# Patient Record
Sex: Male | Born: 1960 | Race: White | Hispanic: No | Marital: Married | Smoking: Never smoker
Health system: Southern US, Community
[De-identification: ages and names within clinical notes are randomized; demographics above are authoritative.]

## PROBLEM LIST (undated history)

## (undated) HISTORY — PX: VASECTOMY: SHX75

---

## 2007-11-20 ENCOUNTER — Emergency Department (HOSPITAL_BASED_OUTPATIENT_CLINIC_OR_DEPARTMENT_OTHER): Admission: EM | Admit: 2007-11-20 | Discharge: 2007-11-20 | Payer: Self-pay | Admitting: Emergency Medicine

## 2016-05-12 ENCOUNTER — Emergency Department (HOSPITAL_COMMUNITY): Payer: BC Managed Care – PPO | Admitting: Certified Registered Nurse Anesthetist

## 2016-05-12 ENCOUNTER — Emergency Department (HOSPITAL_COMMUNITY): Payer: BC Managed Care – PPO

## 2016-05-12 ENCOUNTER — Inpatient Hospital Stay (HOSPITAL_COMMUNITY)
Admission: EM | Admit: 2016-05-12 | Discharge: 2016-05-15 | DRG: 958 | Disposition: A | Discharge status: Rehabilitation Facility | Payer: BC Managed Care – PPO | Attending: Orthopedic Surgery | Admitting: Orthopedic Surgery

## 2016-05-12 ENCOUNTER — Inpatient Hospital Stay (HOSPITAL_COMMUNITY): Payer: BC Managed Care – PPO

## 2016-05-12 ENCOUNTER — Encounter (HOSPITAL_COMMUNITY)
Admission: EM | Disposition: A | Discharge status: Rehabilitation Facility | Payer: Self-pay | Source: Home / Self Care | Attending: Orthopedic Surgery

## 2016-05-12 DIAGNOSIS — S82202B Unspecified fracture of shaft of left tibia, initial encounter for open fracture type I or II: Secondary | ICD-10-CM | POA: Diagnosis present

## 2016-05-12 DIAGNOSIS — S92342A Displaced fracture of fourth metatarsal bone, left foot, initial encounter for closed fracture: Secondary | ICD-10-CM | POA: Diagnosis present

## 2016-05-12 DIAGNOSIS — R0781 Pleurodynia: Secondary | ICD-10-CM | POA: Diagnosis present

## 2016-05-12 DIAGNOSIS — Y92410 Unspecified street and highway as the place of occurrence of the external cause: Secondary | ICD-10-CM

## 2016-05-12 DIAGNOSIS — S82202S Unspecified fracture of shaft of left tibia, sequela: Secondary | ICD-10-CM | POA: Diagnosis present

## 2016-05-12 DIAGNOSIS — M25511 Pain in right shoulder: Secondary | ICD-10-CM | POA: Diagnosis present

## 2016-05-12 DIAGNOSIS — T148XXA Other injury of unspecified body region, initial encounter: Secondary | ICD-10-CM

## 2016-05-12 DIAGNOSIS — D62 Acute posthemorrhagic anemia: Secondary | ICD-10-CM | POA: Diagnosis present

## 2016-05-12 DIAGNOSIS — S7011XA Contusion of right thigh, initial encounter: Secondary | ICD-10-CM | POA: Diagnosis present

## 2016-05-12 DIAGNOSIS — S32591A Other specified fracture of right pubis, initial encounter for closed fracture: Secondary | ICD-10-CM | POA: Diagnosis present

## 2016-05-12 DIAGNOSIS — S82402S Unspecified fracture of shaft of left fibula, sequela: Secondary | ICD-10-CM | POA: Diagnosis not present

## 2016-05-12 DIAGNOSIS — S3022XA Contusion of scrotum and testes, initial encounter: Secondary | ICD-10-CM | POA: Diagnosis present

## 2016-05-12 DIAGNOSIS — T1490XA Injury, unspecified, initial encounter: Secondary | ICD-10-CM | POA: Diagnosis not present

## 2016-05-12 DIAGNOSIS — R609 Edema, unspecified: Secondary | ICD-10-CM | POA: Diagnosis not present

## 2016-05-12 DIAGNOSIS — S3282XA Multiple fractures of pelvis without disruption of pelvic ring, initial encounter for closed fracture: Secondary | ICD-10-CM

## 2016-05-12 DIAGNOSIS — S82232S Displaced oblique fracture of shaft of left tibia, sequela: Secondary | ICD-10-CM | POA: Diagnosis not present

## 2016-05-12 DIAGNOSIS — S92302S Fracture of unspecified metatarsal bone(s), left foot, sequela: Secondary | ICD-10-CM | POA: Diagnosis not present

## 2016-05-12 DIAGNOSIS — S46002S Unspecified injury of muscle(s) and tendon(s) of the rotator cuff of left shoulder, sequela: Secondary | ICD-10-CM | POA: Diagnosis not present

## 2016-05-12 DIAGNOSIS — R52 Pain, unspecified: Secondary | ICD-10-CM | POA: Diagnosis not present

## 2016-05-12 DIAGNOSIS — S92302D Fracture of unspecified metatarsal bone(s), left foot, subsequent encounter for fracture with routine healing: Secondary | ICD-10-CM | POA: Diagnosis not present

## 2016-05-12 DIAGNOSIS — S82832B Other fracture of upper and lower end of left fibula, initial encounter for open fracture type I or II: Secondary | ICD-10-CM | POA: Diagnosis present

## 2016-05-12 DIAGNOSIS — S46002D Unspecified injury of muscle(s) and tendon(s) of the rotator cuff of left shoulder, subsequent encounter: Secondary | ICD-10-CM | POA: Diagnosis not present

## 2016-05-12 DIAGNOSIS — M25562 Pain in left knee: Secondary | ICD-10-CM | POA: Diagnosis not present

## 2016-05-12 DIAGNOSIS — T07XXXA Unspecified multiple injuries, initial encounter: Secondary | ICD-10-CM

## 2016-05-12 DIAGNOSIS — S32409D Unspecified fracture of unspecified acetabulum, subsequent encounter for fracture with routine healing: Secondary | ICD-10-CM | POA: Diagnosis not present

## 2016-05-12 DIAGNOSIS — S92332A Displaced fracture of third metatarsal bone, left foot, initial encounter for closed fracture: Secondary | ICD-10-CM | POA: Diagnosis present

## 2016-05-12 DIAGNOSIS — M25061 Hemarthrosis, right knee: Secondary | ICD-10-CM | POA: Diagnosis not present

## 2016-05-12 DIAGNOSIS — S8292XC Unspecified fracture of left lower leg, initial encounter for open fracture type IIIA, IIIB, or IIIC: Secondary | ICD-10-CM

## 2016-05-12 DIAGNOSIS — M79672 Pain in left foot: Secondary | ICD-10-CM

## 2016-05-12 DIAGNOSIS — S3282XS Multiple fractures of pelvis without disruption of pelvic ring, sequela: Secondary | ICD-10-CM | POA: Diagnosis not present

## 2016-05-12 DIAGNOSIS — Z885 Allergy status to narcotic agent status: Secondary | ICD-10-CM | POA: Diagnosis not present

## 2016-05-12 DIAGNOSIS — S7011XD Contusion of right thigh, subsequent encounter: Secondary | ICD-10-CM | POA: Diagnosis not present

## 2016-05-12 DIAGNOSIS — D696 Thrombocytopenia, unspecified: Secondary | ICD-10-CM | POA: Diagnosis present

## 2016-05-12 DIAGNOSIS — S3022XD Contusion of scrotum and testes, subsequent encounter: Secondary | ICD-10-CM | POA: Diagnosis not present

## 2016-05-12 DIAGNOSIS — S3994XA Unspecified injury of external genitals, initial encounter: Secondary | ICD-10-CM

## 2016-05-12 DIAGNOSIS — S82402E Unspecified fracture of shaft of left fibula, subsequent encounter for open fracture type I or II with routine healing: Secondary | ICD-10-CM | POA: Diagnosis not present

## 2016-05-12 DIAGNOSIS — S82202E Unspecified fracture of shaft of left tibia, subsequent encounter for open fracture type I or II with routine healing: Secondary | ICD-10-CM | POA: Diagnosis not present

## 2016-05-12 DIAGNOSIS — S92322A Displaced fracture of second metatarsal bone, left foot, initial encounter for closed fracture: Secondary | ICD-10-CM | POA: Diagnosis present

## 2016-05-12 DIAGNOSIS — R55 Syncope and collapse: Secondary | ICD-10-CM | POA: Diagnosis not present

## 2016-05-12 DIAGNOSIS — S82392B Other fracture of lower end of left tibia, initial encounter for open fracture type I or II: Secondary | ICD-10-CM | POA: Diagnosis present

## 2016-05-12 HISTORY — PX: ORIF TIBIA FRACTURE: SHX5416

## 2016-05-12 LAB — COMPREHENSIVE METABOLIC PANEL
ALBUMIN: 3.7 g/dL (ref 3.5–5.0)
ALK PHOS: 61 U/L (ref 38–126)
ALT: 18 U/L (ref 17–63)
AST: 33 U/L (ref 15–41)
Anion gap: 9 (ref 5–15)
BILIRUBIN TOTAL: 0.8 mg/dL (ref 0.3–1.2)
BUN: 12 mg/dL (ref 6–20)
CO2: 24 mmol/L (ref 22–32)
CREATININE: 1.14 mg/dL (ref 0.61–1.24)
Calcium: 8.8 mg/dL — ABNORMAL LOW (ref 8.9–10.3)
Chloride: 105 mmol/L (ref 101–111)
GFR calc Af Amer: >60 mL/min (ref >60)
GLUCOSE: 155 mg/dL — AB (ref 65–99)
POTASSIUM: 3.6 mmol/L (ref 3.5–5.1)
Sodium: 138 mmol/L (ref 135–145)
Total Protein: 6.5 g/dL (ref 6.5–8.1)

## 2016-05-12 LAB — I-STAT CHEM 8, ED
BUN: 14 mg/dL (ref 6–20)
CHLORIDE: 103 mmol/L (ref 101–111)
CREATININE: 1.1 mg/dL (ref 0.61–1.24)
Calcium, Ion: 1.13 mmol/L — ABNORMAL LOW (ref 1.15–1.40)
Glucose, Bld: 149 mg/dL — ABNORMAL HIGH (ref 65–99)
HEMATOCRIT: 39 % (ref 39.0–52.0)
Hemoglobin: 13.3 g/dL (ref 13.0–17.0)
Potassium: 3.7 mmol/L (ref 3.5–5.1)
SODIUM: 140 mmol/L (ref 135–145)
TCO2: 27 mmol/L (ref 0–100)

## 2016-05-12 LAB — PROTIME-INR
INR: 1.11
PROTHROMBIN TIME: 14.3 s (ref 11.4–15.2)

## 2016-05-12 LAB — I-STAT CG4 LACTIC ACID, ED: Lactic Acid, Venous: 3.46 mmol/L (ref 0.5–1.9)

## 2016-05-12 LAB — CBC
HCT: 40.6 % (ref 39.0–52.0)
HEMOGLOBIN: 14.1 g/dL (ref 13.0–17.0)
MCH: 31.8 pg (ref 26.0–34.0)
MCHC: 34.7 g/dL (ref 30.0–36.0)
MCV: 91.6 fL (ref 78.0–100.0)
PLATELETS: 203 10*3/uL (ref 150–400)
RBC: 4.43 MIL/uL (ref 4.22–5.81)
RDW: 12.5 % (ref 11.5–15.5)
WBC: 8.9 10*3/uL (ref 4.0–10.5)

## 2016-05-12 LAB — CDS SEROLOGY

## 2016-05-12 LAB — ETHANOL: Alcohol, Ethyl (B): <5 mg/dL (ref <5)

## 2016-05-12 LAB — SAMPLE TO BLOOD BANK

## 2016-05-12 SURGERY — OPEN REDUCTION INTERNAL FIXATION (ORIF) TIBIA FRACTURE
Anesthesia: General | Site: Leg Lower | Laterality: Left

## 2016-05-12 MED ORDER — ENOXAPARIN SODIUM 40 MG/0.4ML ~~LOC~~ SOLN
40.0000 mg | SUBCUTANEOUS | Status: DC
  Administered 2016-05-13 – 2016-05-15 (×3): 40 mg via SUBCUTANEOUS
  Filled 2016-05-12 (×3): qty 0.4

## 2016-05-12 MED ORDER — IOPAMIDOL (ISOVUE-300) INJECTION 61%
INTRAVENOUS | Status: AC
  Administered 2016-05-12: 100 mL via INTRAVENOUS
  Filled 2016-05-12: qty 100

## 2016-05-12 MED ORDER — LIDOCAINE HCL (CARDIAC) 20 MG/ML IV SOLN
INTRAVENOUS | Status: DC | PRN
  Administered 2016-05-12: 90 mg via INTRAVENOUS

## 2016-05-12 MED ORDER — HYDROMORPHONE HCL 1 MG/ML IJ SOLN
INTRAMUSCULAR | Status: AC
  Filled 2016-05-12: qty 1

## 2016-05-12 MED ORDER — FENTANYL CITRATE (PF) 100 MCG/2ML IJ SOLN
100.0000 ug | Freq: Once | INTRAMUSCULAR | Status: AC
  Administered 2016-05-12: 100 ug via INTRAVENOUS
  Filled 2016-05-12: qty 2

## 2016-05-12 MED ORDER — ASPIRIN EC 325 MG PO TBEC: 325.0000 mg | DELAYED_RELEASE_TABLET | Freq: Every day | ORAL | 0 refills | Status: DC

## 2016-05-12 MED ORDER — KETOROLAC TROMETHAMINE 15 MG/ML IJ SOLN
INTRAMUSCULAR | Status: AC
  Filled 2016-05-12: qty 1

## 2016-05-12 MED ORDER — DIPHENHYDRAMINE HCL 12.5 MG/5ML PO ELIX: 12.5000 mg | ORAL_SOLUTION | ORAL | Status: DC | PRN

## 2016-05-12 MED ORDER — ACETAMINOPHEN 500 MG PO TABS
1000.0000 mg | ORAL_TABLET | Freq: Once | ORAL | Status: AC
  Administered 2016-05-12: 1000 mg via ORAL

## 2016-05-12 MED ORDER — POVIDONE-IODINE 7.5 % EX SOLN
Freq: Once | CUTANEOUS | Status: AC
  Administered 2016-05-12: 14:00:00 via TOPICAL

## 2016-05-12 MED ORDER — PROPOFOL 10 MG/ML IV BOLUS
INTRAVENOUS | Status: DC | PRN
  Administered 2016-05-12: 150 mg via INTRAVENOUS

## 2016-05-12 MED ORDER — CEFAZOLIN SODIUM-DEXTROSE 2-4 GM/100ML-% IV SOLN
2.0000 g | Freq: Once | INTRAVENOUS | Status: AC
  Administered 2016-05-12: 2 g via INTRAVENOUS
  Filled 2016-05-12: qty 100

## 2016-05-12 MED ORDER — METHOCARBAMOL 1000 MG/10ML IJ SOLN: 500.0000 mg | Freq: Four times a day (QID) | INTRAVENOUS | Status: DC | PRN

## 2016-05-12 MED ORDER — DOCUSATE SODIUM 100 MG PO CAPS
100.0000 mg | ORAL_CAPSULE | Freq: Two times a day (BID) | ORAL | Status: DC
  Administered 2016-05-12 – 2016-05-15 (×6): 100 mg via ORAL
  Filled 2016-05-12 (×6): qty 1

## 2016-05-12 MED ORDER — METHOCARBAMOL 500 MG PO TABS
500.0000 mg | ORAL_TABLET | Freq: Four times a day (QID) | ORAL | Status: DC | PRN
  Administered 2016-05-12 – 2016-05-14 (×4): 500 mg via ORAL
  Filled 2016-05-12 (×4): qty 1

## 2016-05-12 MED ORDER — NEOSTIGMINE METHYLSULFATE 5 MG/5ML IV SOSY
PREFILLED_SYRINGE | INTRAVENOUS | Status: AC
  Filled 2016-05-12: qty 5

## 2016-05-12 MED ORDER — DOCUSATE SODIUM 100 MG PO CAPS: 100.0000 mg | ORAL_CAPSULE | Freq: Two times a day (BID) | ORAL | 0 refills | Status: DC

## 2016-05-12 MED ORDER — CEFAZOLIN SODIUM-DEXTROSE 2-4 GM/100ML-% IV SOLN
2.0000 g | Freq: Four times a day (QID) | INTRAVENOUS | Status: AC
  Administered 2016-05-12 – 2016-05-13 (×4): 2 g via INTRAVENOUS
  Filled 2016-05-12 (×4): qty 100

## 2016-05-12 MED ORDER — FENTANYL CITRATE (PF) 100 MCG/2ML IJ SOLN
100.0000 ug | Freq: Once | INTRAMUSCULAR | Status: AC
Start: 2016-05-12 — End: 2016-05-12
  Administered 2016-05-12: 100 ug via INTRAVENOUS
  Filled 2016-05-12: qty 2

## 2016-05-12 MED ORDER — GLYCOPYRROLATE 0.2 MG/ML IJ SOLN
INTRAMUSCULAR | Status: DC | PRN
  Administered 2016-05-12: 0.6 mg via INTRAVENOUS

## 2016-05-12 MED ORDER — ROCURONIUM BROMIDE 10 MG/ML (PF) SYRINGE
PREFILLED_SYRINGE | INTRAVENOUS | Status: AC
  Filled 2016-05-12: qty 5

## 2016-05-12 MED ORDER — ACETAMINOPHEN 500 MG PO TABS
ORAL_TABLET | ORAL | Status: AC
  Administered 2016-05-12: 1000 mg via ORAL
  Filled 2016-05-12: qty 2

## 2016-05-12 MED ORDER — KETOROLAC TROMETHAMINE 30 MG/ML IJ SOLN
INTRAMUSCULAR | Status: AC
  Filled 2016-05-12: qty 1

## 2016-05-12 MED ORDER — ACETAMINOPHEN 500 MG PO TABS
1000.0000 mg | ORAL_TABLET | Freq: Three times a day (TID) | ORAL | Status: DC
  Administered 2016-05-12 – 2016-05-15 (×9): 1000 mg via ORAL
  Filled 2016-05-12 (×9): qty 2

## 2016-05-12 MED ORDER — LACTATED RINGERS IV SOLN
INTRAVENOUS | Status: DC
  Administered 2016-05-12 (×2): via INTRAVENOUS

## 2016-05-12 MED ORDER — OXYCODONE HCL 5 MG PO TABS
ORAL_TABLET | ORAL | Status: AC
  Filled 2016-05-12: qty 2

## 2016-05-12 MED ORDER — PROPOFOL 10 MG/ML IV BOLUS
INTRAVENOUS | Status: AC
  Filled 2016-05-12: qty 40

## 2016-05-12 MED ORDER — SUCCINYLCHOLINE CHLORIDE 200 MG/10ML IV SOSY
PREFILLED_SYRINGE | INTRAVENOUS | Status: AC
  Filled 2016-05-12: qty 10

## 2016-05-12 MED ORDER — POLYETHYLENE GLYCOL 3350 17 G PO PACK
17.0000 g | PACK | Freq: Every day | ORAL | Status: DC
  Administered 2016-05-14 – 2016-05-15 (×2): 17 g via ORAL
  Filled 2016-05-12 (×3): qty 1

## 2016-05-12 MED ORDER — ETOMIDATE 2 MG/ML IV SOLN
INTRAVENOUS | Status: AC
  Administered 2016-05-12: 10 mg
  Filled 2016-05-12: qty 10

## 2016-05-12 MED ORDER — OMEPRAZOLE 20 MG PO CPDR: 20.0000 mg | DELAYED_RELEASE_CAPSULE | Freq: Every day | ORAL | 0 refills | Status: DC

## 2016-05-12 MED ORDER — TETANUS-DIPHTH-ACELL PERTUSSIS 5-2.5-18.5 LF-MCG/0.5 IM SUSP
0.5000 mL | Freq: Once | INTRAMUSCULAR | Status: AC
  Administered 2016-05-12: 0.5 mL via INTRAMUSCULAR
  Filled 2016-05-12: qty 0.5

## 2016-05-12 MED ORDER — ENOXAPARIN SODIUM 40 MG/0.4ML ~~LOC~~ SOLN: 40.0000 mg | SUBCUTANEOUS | Status: DC

## 2016-05-12 MED ORDER — GABAPENTIN 300 MG PO CAPS
300.0000 mg | ORAL_CAPSULE | Freq: Once | ORAL | Status: AC
  Administered 2016-05-12: 300 mg via ORAL
  Filled 2016-05-12: qty 1

## 2016-05-12 MED ORDER — HYDROMORPHONE HCL 1 MG/ML IJ SOLN
1.0000 mg | Freq: Once | INTRAMUSCULAR | Status: AC
  Administered 2016-05-12: 1 mg via INTRAVENOUS
  Filled 2016-05-12: qty 1

## 2016-05-12 MED ORDER — ONDANSETRON HCL 4 MG PO TABS: 4.0000 mg | ORAL_TABLET | Freq: Three times a day (TID) | ORAL | 0 refills | Status: DC | PRN

## 2016-05-12 MED ORDER — HYDROMORPHONE HCL 1 MG/ML IJ SOLN
0.5000 mg | INTRAMUSCULAR | Status: DC | PRN
  Administered 2016-05-13: 1 mg via INTRAVENOUS
  Filled 2016-05-12: qty 1

## 2016-05-12 MED ORDER — FENTANYL CITRATE (PF) 250 MCG/5ML IJ SOLN
INTRAMUSCULAR | Status: AC
  Filled 2016-05-12: qty 5

## 2016-05-12 MED ORDER — ACETAMINOPHEN 650 MG RE SUPP: 650.0000 mg | Freq: Four times a day (QID) | RECTAL | Status: DC | PRN

## 2016-05-12 MED ORDER — ONDANSETRON HCL 4 MG PO TABS
4.0000 mg | ORAL_TABLET | Freq: Four times a day (QID) | ORAL | Status: DC | PRN
  Administered 2016-05-14: 4 mg via ORAL
  Filled 2016-05-12: qty 1

## 2016-05-12 MED ORDER — ONDANSETRON HCL 4 MG/2ML IJ SOLN
INTRAMUSCULAR | Status: DC | PRN
  Administered 2016-05-12: 4 mg via INTRAVENOUS

## 2016-05-12 MED ORDER — FENTANYL CITRATE (PF) 100 MCG/2ML IJ SOLN
INTRAMUSCULAR | Status: DC | PRN
  Administered 2016-05-12: 25 ug via INTRAVENOUS
  Administered 2016-05-12 (×3): 50 ug via INTRAVENOUS
  Administered 2016-05-12: 75 ug via INTRAVENOUS

## 2016-05-12 MED ORDER — OXYCODONE-ACETAMINOPHEN 5-325 MG PO TABS: 1.0000 | ORAL_TABLET | ORAL | 0 refills | Status: DC | PRN

## 2016-05-12 MED ORDER — LACTATED RINGERS IV SOLN
INTRAVENOUS | Status: DC
  Administered 2016-05-12 – 2016-05-14 (×3): via INTRAVENOUS

## 2016-05-12 MED ORDER — LACTATED RINGERS IV SOLN: INTRAVENOUS | Status: DC | PRN

## 2016-05-12 MED ORDER — MIDAZOLAM HCL 2 MG/2ML IJ SOLN
INTRAMUSCULAR | Status: AC
  Filled 2016-05-12: qty 2

## 2016-05-12 MED ORDER — KETOROLAC TROMETHAMINE 15 MG/ML IJ SOLN
15.0000 mg | Freq: Three times a day (TID) | INTRAMUSCULAR | Status: DC | PRN
  Administered 2016-05-12 – 2016-05-15 (×3): 15 mg via INTRAVENOUS
  Filled 2016-05-12 (×2): qty 1

## 2016-05-12 MED ORDER — CEFAZOLIN SODIUM 1 G IJ SOLR
INTRAMUSCULAR | Status: AC
  Filled 2016-05-12: qty 10

## 2016-05-12 MED ORDER — ONDANSETRON HCL 4 MG/2ML IJ SOLN
INTRAMUSCULAR | Status: AC
  Filled 2016-05-12: qty 2

## 2016-05-12 MED ORDER — ALBUMIN HUMAN 5 % IV SOLN
INTRAVENOUS | Status: DC | PRN
  Administered 2016-05-12: 16:00:00 via INTRAVENOUS

## 2016-05-12 MED ORDER — OXYCODONE HCL 5 MG PO TABS
5.0000 mg | ORAL_TABLET | ORAL | Status: DC | PRN
  Administered 2016-05-12: 10 mg via ORAL
  Administered 2016-05-13 – 2016-05-14 (×2): 15 mg via ORAL
  Administered 2016-05-14: 5 mg via ORAL
  Filled 2016-05-12: qty 3
  Filled 2016-05-12: qty 1
  Filled 2016-05-12: qty 3

## 2016-05-12 MED ORDER — ONDANSETRON HCL 4 MG/2ML IJ SOLN
4.0000 mg | Freq: Four times a day (QID) | INTRAMUSCULAR | Status: DC | PRN
  Administered 2016-05-13 – 2016-05-15 (×2): 4 mg via INTRAVENOUS
  Filled 2016-05-12 (×2): qty 2

## 2016-05-12 MED ORDER — ROCURONIUM BROMIDE 100 MG/10ML IV SOLN
INTRAVENOUS | Status: DC | PRN
  Administered 2016-05-12: 50 mg via INTRAVENOUS

## 2016-05-12 MED ORDER — NEOSTIGMINE METHYLSULFATE 10 MG/10ML IV SOLN
INTRAVENOUS | Status: DC | PRN
  Administered 2016-05-12: 3 mg via INTRAVENOUS

## 2016-05-12 MED ORDER — CEFAZOLIN SODIUM-DEXTROSE 2-4 GM/100ML-% IV SOLN
2.0000 g | INTRAVENOUS | Status: AC
  Administered 2016-05-12: 2 g via INTRAVENOUS
  Filled 2016-05-12: qty 100

## 2016-05-12 MED ORDER — HYDROMORPHONE HCL 1 MG/ML IJ SOLN
0.2500 mg | INTRAMUSCULAR | Status: DC | PRN
  Administered 2016-05-12 (×2): 0.5 mg via INTRAVENOUS

## 2016-05-12 MED ORDER — METHOCARBAMOL 500 MG PO TABS: 500.0000 mg | ORAL_TABLET | Freq: Four times a day (QID) | ORAL | 0 refills | Status: DC | PRN

## 2016-05-12 MED ORDER — ACETAMINOPHEN 325 MG PO TABS: 650.0000 mg | ORAL_TABLET | Freq: Four times a day (QID) | ORAL | Status: DC | PRN

## 2016-05-12 MED ORDER — PROMETHAZINE HCL 25 MG/ML IJ SOLN: 6.2500 mg | INTRAMUSCULAR | Status: DC | PRN

## 2016-05-12 MED ORDER — SODIUM CHLORIDE 0.9 % IR SOLN
Status: DC | PRN
  Administered 2016-05-12: 3000 mL

## 2016-05-12 MED ORDER — LIDOCAINE 2% (20 MG/ML) 5 ML SYRINGE
INTRAMUSCULAR | Status: AC
  Filled 2016-05-12: qty 20

## 2016-05-12 SURGICAL SUPPLY — 82 items
BANDAGE ACE 4X5 VEL STRL LF (GAUZE/BANDAGES/DRESSINGS) ×3 IMPLANT
BANDAGE ACE 6X5 VEL STRL LF (GAUZE/BANDAGES/DRESSINGS) ×3 IMPLANT
BIT DRILL AO GAMMA 4.2X130 (BIT) ×3 IMPLANT
BIT DRILL AO GAMMA 4.2X180 (BIT) ×3 IMPLANT
BIT DRILL AO GAMMA 4.2X340 (BIT) ×3 IMPLANT
BLADE SURG 10 STRL SS (BLADE) ×3 IMPLANT
BNDG COHESIVE 4X5 TAN STRL (GAUZE/BANDAGES/DRESSINGS) ×3 IMPLANT
BNDG GAUZE ELAST 4 BULKY (GAUZE/BANDAGES/DRESSINGS) ×3 IMPLANT
CANISTER WOUND CARE 500ML ATS (WOUND CARE) ×3 IMPLANT
CHLORAPREP W/TINT 26ML (MISCELLANEOUS) ×3 IMPLANT
COVER MAYO STAND STRL (DRAPES) ×3 IMPLANT
COVER SURGICAL LIGHT HANDLE (MISCELLANEOUS) ×6 IMPLANT
CUFF TOURNIQUET SINGLE 34IN LL (TOURNIQUET CUFF) IMPLANT
CUFF TOURNIQUET SINGLE 44IN (TOURNIQUET CUFF) IMPLANT
DRAPE C-ARM 42X72 X-RAY (DRAPES) ×3 IMPLANT
DRAPE C-ARMOR (DRAPES) IMPLANT
DRAPE HALF SHEET 40X57 (DRAPES) ×3 IMPLANT
DRAPE IMP U-DRAPE 54X76 (DRAPES) ×3 IMPLANT
DRAPE INCISE IOBAN 66X45 STRL (DRAPES) ×3 IMPLANT
DRESSING OPSITE X SMALL 2X3 (GAUZE/BANDAGES/DRESSINGS) ×3 IMPLANT
DRSG ADAPTIC 3X8 NADH LF (GAUZE/BANDAGES/DRESSINGS) ×3 IMPLANT
DRSG MEPILEX BORDER 4X4 (GAUZE/BANDAGES/DRESSINGS) ×3 IMPLANT
DRSG TEGADERM 4X4.75 (GAUZE/BANDAGES/DRESSINGS) ×6 IMPLANT
DRSG VAC ATS SM SENSATRAC (GAUZE/BANDAGES/DRESSINGS) ×3 IMPLANT
ELECT CAUTERY BLADE 6.4 (BLADE) ×3 IMPLANT
ELECT REM PT RETURN 9FT ADLT (ELECTROSURGICAL) ×3
ELECTRODE REM PT RTRN 9FT ADLT (ELECTROSURGICAL) ×1 IMPLANT
GAUZE SPONGE 4X4 12PLY STRL (GAUZE/BANDAGES/DRESSINGS) ×3 IMPLANT
GAUZE SPONGE 4X4 12PLY STRL LF (GAUZE/BANDAGES/DRESSINGS) ×3 IMPLANT
GAUZE XEROFORM 1X8 LF (GAUZE/BANDAGES/DRESSINGS) ×3 IMPLANT
GLOVE BIO SURGEON STRL SZ7.5 (GLOVE) ×6 IMPLANT
GLOVE BIOGEL PI IND STRL 8 (GLOVE) ×2 IMPLANT
GLOVE BIOGEL PI INDICATOR 8 (GLOVE) ×4
GOWN STRL REUS W/ TWL LRG LVL3 (GOWN DISPOSABLE) ×3 IMPLANT
GOWN STRL REUS W/TWL LRG LVL3 (GOWN DISPOSABLE) ×6
GUIDEROD T2 3X1000 (ROD) ×3 IMPLANT
GUIDEWIRE GAMMA (WIRE) ×6 IMPLANT
K-WIRE FIXATION 3X285 COATED (WIRE) ×6
KIT BASIN OR (CUSTOM PROCEDURE TRAY) ×3 IMPLANT
KIT ROOM TURNOVER OR (KITS) ×3 IMPLANT
KWIRE FIXATION 3X285 COATED (WIRE) ×2 IMPLANT
MANIFOLD NEPTUNE II (INSTRUMENTS) ×3 IMPLANT
NAIL ELAS INSERT SLV SPI 8-11 (MISCELLANEOUS) ×2 IMPLANT
NAIL TIBIAL STANDARD 9X360MM (Nail) ×3 IMPLANT
NAIL TIBIAL STD 9X360MM (Nail) ×1 IMPLANT
NS IRRIG 1000ML POUR BTL (IV SOLUTION) ×3 IMPLANT
PACK ORTHO EXTREMITY (CUSTOM PROCEDURE TRAY) ×3 IMPLANT
PACK UNIVERSAL I (CUSTOM PROCEDURE TRAY) ×3 IMPLANT
PAD ABD 8X10 STRL (GAUZE/BANDAGES/DRESSINGS) ×3 IMPLANT
PAD ARMBOARD 7.5X6 YLW CONV (MISCELLANEOUS) ×6 IMPLANT
PAD CAST 4YDX4 CTTN HI CHSV (CAST SUPPLIES) ×1 IMPLANT
PADDING CAST ABS 6INX4YD NS (CAST SUPPLIES) ×2
PADDING CAST ABS COTTON 6X4 NS (CAST SUPPLIES) ×1 IMPLANT
PADDING CAST COTTON 4X4 STRL (CAST SUPPLIES) ×2
REAMER INTRAMEDULLARY 8MM 510 (MISCELLANEOUS) ×2 IMPLANT
SCREW LOCKING FULL THREAD 5X52 (Screw) ×3 IMPLANT
SCREW LOCKING T2 F/T  5MMX45MM (Screw) ×2 IMPLANT
SCREW LOCKING T2 F/T  5MMX50MM (Screw) ×2 IMPLANT
SCREW LOCKING T2 F/T  5X37.5MM (Screw) ×2 IMPLANT
SCREW LOCKING T2 F/T  5X42.5MM (Screw) ×2 IMPLANT
SCREW LOCKING T2 F/T 5MMX45MM (Screw) ×1 IMPLANT
SCREW LOCKING T2 F/T 5MMX50MM (Screw) ×1 IMPLANT
SCREW LOCKING T2 F/T 5X37.5MM (Screw) ×1 IMPLANT
SCREW LOCKING T2 F/T 5X42.5MM (Screw) ×1 IMPLANT
SCREW LOCKING THREADED 5X47.5 (Screw) ×3 IMPLANT
SET CYSTO W/LG BORE CLAMP LF (SET/KITS/TRAYS/PACK) ×3 IMPLANT
SPONGE LAP 18X18 X RAY DECT (DISPOSABLE) ×3 IMPLANT
STAPLER VISISTAT 35W (STAPLE) IMPLANT
SUT ETHILON 3 0 PS 1 (SUTURE) ×9 IMPLANT
SUT MON AB 2-0 CT1 27 (SUTURE) ×3 IMPLANT
SUT MON AB 2-0 CT1 36 (SUTURE) ×3 IMPLANT
SUT MON AB 4-0 RB1 27 (SUTURE) ×3 IMPLANT
SUT VIC AB 0 CT1 27 (SUTURE) ×2
SUT VIC AB 0 CT1 27XBRD ANBCTR (SUTURE) ×1 IMPLANT
SYR BULB IRRIGATION 50ML (SYRINGE) ×3 IMPLANT
TOWEL OR 17X24 6PK STRL BLUE (TOWEL DISPOSABLE) ×3 IMPLANT
TOWEL OR 17X26 10 PK STRL BLUE (TOWEL DISPOSABLE) ×3 IMPLANT
TOWEL OR NON WOVEN STRL DISP B (DISPOSABLE) ×3 IMPLANT
TUBE CONNECTING 12'X1/4 (SUCTIONS) ×1
TUBE CONNECTING 12X1/4 (SUCTIONS) ×2 IMPLANT
WATER STERILE IRR 1000ML POUR (IV SOLUTION) ×3 IMPLANT
YANKAUER SUCT BULB TIP NO VENT (SUCTIONS) ×3 IMPLANT

## 2016-05-12 NOTE — Anesthesia Procedure Notes (Signed)
Procedure Name: Intubation Date/Time: 05/12/2016 2:42 PM Performed by: Valda Favia Pre-anesthesia Checklist: Patient identified, Emergency Drugs available, Suction available, Patient being monitored and Timeout performed Patient Re-evaluated:Patient Re-evaluated prior to inductionOxygen Delivery Method: Circle system utilized Preoxygenation: Pre-oxygenation with 100% oxygen Intubation Type: IV induction Ventilation: Mask ventilation without difficulty Laryngoscope Size: Mac and 4 Grade View: Grade I Tube type: Oral Tube size: 7.5 mm Number of attempts: 1 Airway Equipment and Method: Stylet Placement Confirmation: ETT inserted through vocal cords under direct vision,  positive ETCO2 and breath sounds checked- equal and bilateral Secured at: 22 cm Tube secured with: Tape Dental Injury: Teeth and Oropharynx as per pre-operative assessment

## 2016-05-12 NOTE — ED Notes (Signed)
Patient transported to CT 

## 2016-05-12 NOTE — Transfer of Care (Signed)
Immediate Anesthesia Transfer of Care Note  Patient: Adine MaduraMark Marton  Procedure(s) Performed: Procedure(s): OPEN REDUCTION INTERNAL FIXATION (ORIF) TIBIA FRACTURE (Left)  Patient Location: PACU  Anesthesia Type:General  Level of Consciousness: awake and alert   Airway & Oxygen Therapy: Patient Spontanous Breathing and Patient connected to nasal cannula oxygen  Post-op Assessment: Report given to RN and Post -op Vital signs reviewed and stable  Post vital signs: Reviewed and stable  Last Vitals:  Vitals:   05/12/16 1300 05/12/16 1637  BP: 136/81   Pulse: 61 88  Resp: 10 14  Temp:  36.3 C    Last Pain:  Vitals:   05/12/16 1637  TempSrc:   PainSc: 0-No pain         Complications: No apparent anesthesia complications

## 2016-05-12 NOTE — Discharge Instructions (Signed)
Elevate leg as much as possible to reduce pain and swelling.  Diet: As you were doing prior to hospitalization   Dressing:  Leave in place until follow up visit.  Activity:  Increase activity slowly as tolerated, but follow the weight bearing instructions below.  The rules on driving is that you can not be taking narcotics while you drive, and you must feel in control of the vehicle.    Weight Bearing:   As tolerated.  Utilize walking boot.  To prevent constipation: you may use a stool softener such as -  Colace (over the counter) 100 mg by mouth twice a day  Drink plenty of fluids (prune juice may be helpful) and high fiber foods Miralax (over the counter) for constipation as needed.    Itching:  If you experience itching with your medications, try taking only a single pain pill, or even half a pain pill at a time.  You may take up to 10 pain pills per day, and you can also use benadryl over the counter for itching or also to help with sleep.   Precautions:  If you experience chest pain or shortness of breath - call 911 immediately for transfer to the hospital emergency department!!  If you develop a fever greater that 101 F, purulent drainage from wound, increased redness or drainage from wound, or calf pain -- Call the office at [phone]                                                Follow- Up Appointment:  Please call for an appointment to be seen in 1 week [place] - [phone]  Ultrasound of testicles performed in Emergency Department for testicular contusion recommends Follow-up scrotal ultrasound in 3-6 weeks.  See PCP and / or Urologist if needed. recommended to reassess.

## 2016-05-12 NOTE — Progress Notes (Signed)
Orthopedic Tech Progress Note Patient Details:  Eric MaduraMark Schmitt 02/10/1960 [id number]  Ortho Devices Type of Ortho Device: Ace wrap, Post (short leg) splint Ortho Device/Splint Location: lle Ortho Device/Splint Interventions: Application   [name] 05/12/2016, 10:42 AM As ordered by Dr. Gerhard Munchobert Lockwood
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
## 2016-05-12 NOTE — Op Note (Signed)
05/12/2016  4:03 PM  PATIENT:  Eric Schmitt    PRE-OPERATIVE DIAGNOSIS:  open left tibia fracture  POST-OPERATIVE DIAGNOSIS:  Same  PROCEDURE:  OPEN REDUCTION INTERNAL FIXATION (ORIF) TIBIA FRACTURE  SURGEON:  MURPHY, Jewel BaizeIMOTHY D, MD  ASSISTANT: Aquilla HackerHenry Martensen, PA-C, he was present and scrubbed throughout the case, critical for completion in a timely fashion, and for retraction, instrumentation, and closure.   ANESTHESIA:   General  PREOPERATIVE INDICATIONS:  Eric MaduraMark Emrich is a  56 y.o. male with a diagnosis of open left tibia fracture who failed conservative measures and elected for surgical management.    The risks benefits and alternatives were discussed with the patient preoperatively including but not limited to the risks of infection, bleeding, nerve injury, cardiopulmonary complications, the need for revision surgery, among others, and the patient was willing to proceed.  OPERATIVE IMPLANTS: Stryker T2 Tibial nail    BLOOD LOSS: minimal  COMPLICATIONS: none  TOURNIQUET TIME: none  OPERATIVE PROCEDURE:  Patient was identified in the preoperative holding area and site was marked by me He was transported to the operating theater and placed on the table in supine position taking care to pad all bony prominences. After a preincinduction time out anesthesia was induced. The left lower extremity was prepped and draped in normal sterile fashion and a pre-incision timeout was performed. Eric MaduraMark Bedoya received ancef for preoperative antibiotics.    I delivered both fracture ends and performed a thourough excisional debridement with cobb/knife/scissors as needed. I copiously irrigated the open wound and fracture ends. I later performed a complex closure of the 13 cm Laceration  The leg was placed over the bone foam. I then made a 4 cm incision over the quad tendon. I dissected down and incised the quad tendon. I placed the suprapatellar sleeve and secured it into place taking care to not  damage the patella femoral joint.   I placed a guidewire under fluoroscopic guidance just medial to lateral tibial spine. I was happy with this placement on all views. I used the entry reamer to create a path into the proximal tibia staying out of the joint itself.  I then passed the ball tip guidewire down the tibia and across the fracture site. I held appropriate reduction confirmed on multiple views of fluoroscopy and sequentially reamed up to an appropriate size with appropriate chatter. I selected the above-sized nail and passed it down the ball-tipped guidewire and seated it completely to a was sunk into the proximal tibia.  I then used the outrigger placed proximal locking screws. I was happy with her length and placement on multiple oblique fluoroscopic views.  Next I confirmed appropriate rotation of the tibia with fracture tease and orientation of the patella and toes. I then used a perfect circles technique to place  distal static interlock screws.  The wounds were then all thoroughly irrigated and closed in layers. Sterile dressing was applied and he was taken to the PACU in stable condition.  POST OPERATIVE PLAN: WBAT in boot, DVT prophylaxis: Early ambulation, SCD's, chemical px

## 2016-05-12 NOTE — Anesthesia Preprocedure Evaluation (Signed)
Anesthesia Evaluation  Patient identified by MRN, date of birth, ID band Patient awake    Reviewed: Allergy & Precautions, NPO status , Patient's Chart, lab work & pertinent test results  Airway Mallampati: II   Neck ROM: Full    Dental no notable dental hx.    Pulmonary neg pulmonary ROS,    breath sounds clear to auscultation       Cardiovascular negative cardio ROS   Rhythm:Regular Rate:Normal     Neuro/Psych negative neurological ROS  negative psych ROS   GI/Hepatic negative GI ROS, Neg liver ROS,   Endo/Other  negative endocrine ROS  Renal/GU negative Renal ROS  negative genitourinary   Musculoskeletal Open tib-fib fracture   Abdominal   Peds negative pediatric ROS (+)  Hematology negative hematology ROS (+)   Anesthesia Other Findings   Reproductive/Obstetrics negative OB ROS                             Anesthesia Physical Anesthesia Plan  ASA: I  Anesthesia Plan: General   Post-op Pain Management:    Induction: Intravenous  Airway Management Planned: Oral ETT  Additional Equipment:   Intra-op Plan:   Post-operative Plan: Extubation in OR  Informed Consent: I have reviewed the patients History and Physical, chart, labs and discussed the procedure including the risks, benefits and alternatives for the proposed anesthesia with the patient or authorized representative who has indicated his/her understanding and acceptance.   Dental advisory given  Plan Discussed with: CRNA  Anesthesia Plan Comments:         Anesthesia Quick Evaluation
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
## 2016-05-12 NOTE — Anesthesia Postprocedure Evaluation (Signed)
Anesthesia Post Note  Patient: Adine MaduraMark Sesay  Procedure(s) Performed: Procedure(s) (LRB): OPEN REDUCTION INTERNAL FIXATION (ORIF) TIBIA FRACTURE (Left)  Patient location during evaluation: PACU Anesthesia Type: General Level of consciousness: awake and alert Pain management: pain level controlled Vital Signs Assessment: post-procedure vital signs reviewed and stable Respiratory status: spontaneous breathing, nonlabored ventilation and respiratory function stable Cardiovascular status: blood pressure returned to baseline and stable Postop Assessment: no signs of nausea or vomiting Anesthetic complications: no       Last Vitals:  Vitals:   05/12/16 1900 05/12/16 2028  BP: 118/67 104/73  Pulse: 73 79  Resp:  15  Temp: 36.8 C 36.7 C    Last Pain:  Vitals:   05/12/16 2028  TempSrc: Oral  PainSc:                  Foster Sonnier,W. EDMOND

## 2016-05-12 NOTE — ED Notes (Signed)
Attempted to reduce leg, Per Dr. Jeraldine LootsLockwood, emergently gave 10mg  etomadate for reduction of leg.

## 2016-05-12 NOTE — Progress Notes (Signed)
Report to Henrietta Dinearoline Moseley RN as primary caregiver.

## 2016-05-12 NOTE — Progress Notes (Signed)
Orthopedic Tech Progress Note Patient Details:  Eric Schmitt 08/27/1960 [id number]  Ortho Devices Type of Ortho Device: CAM walker, Postop shoe/boot Ortho Device/Splint Location: LLE cam walker-applied prafo boot Ortho Device/Splint Interventions: Ordered, Application   Jennye MoccasinHughes, [name] 05/12/2016, 6:46 PM

## 2016-05-12 NOTE — ED Notes (Signed)
Ortho at bedside splinting leg.
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
## 2016-05-12 NOTE — ED Notes (Signed)
Pt in CT.
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
## 2016-05-13 ENCOUNTER — Inpatient Hospital Stay (HOSPITAL_COMMUNITY): Payer: BC Managed Care – PPO

## 2016-05-13 ENCOUNTER — Encounter (HOSPITAL_COMMUNITY): Payer: Self-pay | Admitting: General Practice

## 2016-05-13 DIAGNOSIS — D62 Acute posthemorrhagic anemia: Secondary | ICD-10-CM | POA: Diagnosis present

## 2016-05-13 DIAGNOSIS — S3022XA Contusion of scrotum and testes, initial encounter: Secondary | ICD-10-CM | POA: Diagnosis present

## 2016-05-13 DIAGNOSIS — S92302S Fracture of unspecified metatarsal bone(s), left foot, sequela: Secondary | ICD-10-CM | POA: Diagnosis present

## 2016-05-13 DIAGNOSIS — S3282XA Multiple fractures of pelvis without disruption of pelvic ring, initial encounter for closed fracture: Secondary | ICD-10-CM | POA: Diagnosis present

## 2016-05-13 LAB — URINALYSIS, ROUTINE W REFLEX MICROSCOPIC
BILIRUBIN URINE: NEGATIVE
GLUCOSE, UA: NEGATIVE mg/dL
HGB URINE DIPSTICK: NEGATIVE
Ketones, ur: NEGATIVE mg/dL
Leukocytes, UA: NEGATIVE
Nitrite: NEGATIVE
PROTEIN: NEGATIVE mg/dL
Specific Gravity, Urine: 1.041 — ABNORMAL HIGH (ref 1.005–1.030)
pH: 5 (ref 5.0–8.0)

## 2016-05-13 LAB — CBC
HCT: 26.3 % — ABNORMAL LOW (ref 39.0–52.0)
HEMOGLOBIN: 9 g/dL — AB (ref 13.0–17.0)
MCH: 31.4 pg (ref 26.0–34.0)
MCHC: 34.2 g/dL (ref 30.0–36.0)
MCV: 91.6 fL (ref 78.0–100.0)
Platelets: 128 10*3/uL — ABNORMAL LOW (ref 150–400)
RBC: 2.87 MIL/uL — AB (ref 4.22–5.81)
RDW: 12.6 % (ref 11.5–15.5)
WBC: 7.9 10*3/uL (ref 4.0–10.5)

## 2016-05-13 LAB — HIV ANTIBODY (ROUTINE TESTING W REFLEX): HIV SCREEN 4TH GENERATION: NONREACTIVE

## 2016-05-13 MED ORDER — HYDROMORPHONE HCL 1 MG/ML IJ SOLN
0.5000 mg | INTRAMUSCULAR | Status: DC | PRN
  Administered 2016-05-13: 1 mg via INTRAVENOUS
  Filled 2016-05-13: qty 1

## 2016-05-13 NOTE — Progress Notes (Signed)
Assessment / Plan:: 1 Day Post-Op  S/P Procedure(s) (LRB): OPEN REDUCTION INTERNAL FIXATION (ORIF) TIBIA FRACTURE (Left) by Dr. Jewel Baizeimothy D. Eulah PontMurphy on 05/12/16  Principal Problem:   Open fracture of left tibia Active Problems:   Open left tibial fracture   Multiple closed fractures of metatarsal bone of left foot   Scrotal hematoma   Multiple closed fractures of pelvis without disruption of pelvic ring (HCC)   Acute blood loss anemia   Type II Open Left tibia fracture  -WBAT. Continue vac dressing until tomorrow, 05/14/16.  -Continue abx 24 hrs  Acute Blood Loss Anemia, Likely w/ dilutional component - Hgb 9.0 from 13.3.  Plts 128. Continue to follow.  Closed Right inf pubic ramus / acet fx -Non-op management. -WBAT. Mobilize w/ PT  Left Closed Nondisplaced Metatarsal Fxs -Non-operative management -WBAT in Boot LLE  Scrotal contusion -Improving. -Urinating.  Did require I/O post op. -per trauma.  Apply ice prn.   Right knee pain -Improving. XR Negative.  Some laxity w/ varus stress.  Minimal effusion, no bruising.   -Knee immobilizer if unstable when ambulating.  Consider MRI.  Advance diet as tolerated Up with therapy D/C IV fluids when tolerating adequate PO Continue pain medications as prescribed - Wean IV narcotics when able Continue ABX therapy 24 hrs due to open fracture Incentive Spirometry Elevate and apply ice  Weight Bearing: Weight Bearing as Tolerated (WBAT) BLE Dressings: Reinforce Prn.  Continue vac.  VTE prophylaxis: Lovenox, SCDs, ambulation Dispo: Pending PT eval.  He would prefer to d/c to home.  They have 3 stairs into their home.  Subjective: Patient reports pain as moderate. Pain controlled with IV and PO meds.  Tolerating some liquids.  Urinating - needed I/O cath postop.  No CP, SOB.  OOB to standing w/ N/V.    Objective:   VITALS:   Vitals:   05/12/16 1747 05/12/16 1900 05/12/16 2028 05/13/16 0440  BP: 126/68 118/67 104/73 127/67    Pulse: 73 73 79 70  Resp:   15 15  Temp:  98.3 F (36.8 C) 98 F (36.7 C) 99 F (37.2 C)  TempSrc:   Oral Oral  SpO2: 100% 96% 99% 96%  Weight:      Height:       CBC Latest Ref Rng & Units 05/13/2016 05/12/2016 05/12/2016  WBC 4.0 - 10.5 K/uL 7.9 - 8.9  Hemoglobin 13.0 - 17.0 g/dL 9.0(L) 13.3 14.1  Hematocrit 39.0 - 52.0 % 26.3(L) 39.0 40.6  Platelets 150 - 400 K/uL 128(L) - 203   BMP Latest Ref Rng & Units 05/12/2016 05/12/2016  Glucose 65 - 99 mg/dL 098(J149(H) 191(Y155(H)  BUN 6 - 20 mg/dL 14 12  Creatinine 7.820.61 - 1.24 mg/dL 9.561.10 2.131.14  Sodium 086135 - 145 mmol/L 140 138  Potassium 3.5 - 5.1 mmol/L 3.7 3.6  Chloride 101 - 111 mmol/L 103 105  CO2 22 - 32 mmol/L - 24  Calcium 8.9 - 10.3 mg/dL - 8.8(L)   Intake/Output      05/08 0701 - 05/09 0700 05/09 0701 - 05/10 0700   P.O. 220    I.V. (mL/kg) 3612.5 (44.3)    Blood 0    Other 0    IV Piggyback 350    Total Intake(mL/kg) 4182.5 (51.3)    Urine (mL/kg/hr) 730 450 (4.9)   Emesis/NG output 250    Drains 0    Other 0    Blood 700    Total Output 1680 450   Net +  2502.5 -450          Physical Exam: General: NAD.  Supine in bed. Pleasant, conversant. Wife at bedside Resp: No increased wob Cardio: regular rate and rhythm ABD flat, soft Neurologically intact  MSK Left LE: Compartments soft, foot warm and swollen with moderate tenderness to palpation midfoot / metatarsals. EHL, FHL, dorsiflexion, plantarflexion all intact. PRAFO in place with heel floated.  Vacuum dressing in place with adequate suction and minimal bloody drainage. Right LE: Pain with range of motion. Pelvis stable. Right knee mildly tender to palpation without ecchymosis. Mild if any effusion. Laxity with valgus stress although difficult to compare to opposite/operative side. Left UE: Pain with range of motion which is significantly limited. Nontender to palpation. No ecchymosis.     Lucretia Kern Martensen III, PA-C 05/13/2016, 8:08 AM
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
## 2016-05-13 NOTE — Progress Notes (Addendum)
Inpatient Rehabilitation  OT and PT have evaluated pt. and are recommending IP Rehab.  Patient was screened by Weldon PickingSusan Tama Grosz for appropriateness for an Inpatient Acute Rehab consult.  At this time, we are recommending Inpatient Rehab consult.  Please order consult if you are agreeable.  Weldon PickingSusan Dorn Hartshorne PT Inpatient Rehab Admissions Coordinator Cell [phone] Office [phone]
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
## 2016-05-14 ENCOUNTER — Encounter (HOSPITAL_COMMUNITY): Payer: Self-pay | Admitting: Physical Medicine and Rehabilitation

## 2016-05-14 DIAGNOSIS — S82232S Displaced oblique fracture of shaft of left tibia, sequela: Secondary | ICD-10-CM

## 2016-05-14 DIAGNOSIS — S3282XS Multiple fractures of pelvis without disruption of pelvic ring, sequela: Secondary | ICD-10-CM

## 2016-05-14 DIAGNOSIS — S46002S Unspecified injury of muscle(s) and tendon(s) of the rotator cuff of left shoulder, sequela: Secondary | ICD-10-CM

## 2016-05-14 LAB — BASIC METABOLIC PANEL
Anion gap: 5 (ref 5–15)
BUN: 5 mg/dL — ABNORMAL LOW (ref 6–20)
CALCIUM: 7.7 mg/dL — AB (ref 8.9–10.3)
CO2: 30 mmol/L (ref 22–32)
Chloride: 103 mmol/L (ref 101–111)
Creatinine, Ser: 0.84 mg/dL (ref 0.61–1.24)
GLUCOSE: 115 mg/dL — AB (ref 65–99)
Potassium: 3.6 mmol/L (ref 3.5–5.1)
Sodium: 138 mmol/L (ref 135–145)

## 2016-05-14 LAB — CBC
HCT: 23.9 % — ABNORMAL LOW (ref 39.0–52.0)
Hemoglobin: 8.2 g/dL — ABNORMAL LOW (ref 13.0–17.0)
MCH: 31.8 pg (ref 26.0–34.0)
MCHC: 34.3 g/dL (ref 30.0–36.0)
MCV: 92.6 fL (ref 78.0–100.0)
PLATELETS: 115 10*3/uL — AB (ref 150–400)
RBC: 2.58 MIL/uL — ABNORMAL LOW (ref 4.22–5.81)
RDW: 12.7 % (ref 11.5–15.5)
WBC: 7.6 10*3/uL (ref 4.0–10.5)

## 2016-05-14 MED ORDER — FERROUS SULFATE 325 (65 FE) MG PO TABS
325.0000 mg | ORAL_TABLET | Freq: Two times a day (BID) | ORAL | Status: DC
  Administered 2016-05-14 – 2016-05-15 (×2): 325 mg via ORAL
  Filled 2016-05-14 (×2): qty 1
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
## 2016-05-14 NOTE — Progress Notes (Signed)
Inpatient Rehabilitation  Met with patient to discuss team's recommendation for IP Rehab.  Shared booklets and answered questions.  Patient is eager to return home and needs to be as independent as possible as wife works during the day.  I have received insurance authorization starting 05/15/16 and plan to follow up with the team in the morning to ensure medical readiness prior to hopeful admission.  Please call with questions.   Carmelia Roller., CCC/SLP Admission Coordinator  [place]  Cell [phone]

## 2016-05-14 NOTE — Progress Notes (Signed)
Assessment / Plan:: 2 Days Post-Op  S/P Procedure(s) (LRB): OPEN REDUCTION INTERNAL FIXATION (ORIF) TIBIA FRACTURE (Left) by Dr. Jewel Baizeimothy D. Eulah PontMurphy on 05/12/16  Principal Problem:   Open fracture of left tibia Active Problems:   Open left tibial fracture   Multiple closed fractures of metatarsal bone of left foot   Scrotal hematoma   Multiple closed fractures of pelvis without disruption of pelvic ring (HCC)   Acute blood loss anemia   Type II Open Left tibia fracture  -WBAT. D/C vac dressing Elevate and apply ice.  Acute Blood Loss Anemia, Likely w/ some dilutional component - Hgb 8.2<<13.3.  Plts Pending. Continue to follow.  Closed Right inf pubic ramus / acet fx -Non-op management. -WBAT. Mobilize w/ PT  Left Closed Nondisplaced Metatarsal Fxs -Non-operative management -WBAT in Boot LLE  Scrotal contusion -Improving. -Urinating.  Did require I/O post op. -per trauma.  Apply ice prn.   Left Shoulder pain and limited ROM -Dr. Harlene RamusM to eval today.  Consider MRI.  Right knee pain -Improving. XR Negative.  Some laxity w/ varus stress.  Minimal effusion, no bruising.   -Knee immobilizer if unstable when ambulating.  Consider MRI.  Dr. Harlene RamusM to evaluate   Up with therapy D/C IV fluids  Continue pain medications as prescribed - Wean IV narcotics when able Continue ABX therapy 24 hrs due to open fracture Incentive Spirometry Elevate and apply ice  Weight Bearing: Weight Bearing as Tolerated (WBAT) BLE Dressings: Reinforce Prn.  Continue vac.  VTE prophylaxis: Lovenox, SCDs, ambulation Dispo: CIR planning in progress.  Likely fit for transfer later today or tomorrow. Subjective: Patient reports pain as moderate. Pain controlled with IV and PO meds.  Tolerating diet.  No BM.  Urinating.  No CP, SOB.  OOB to standing and to chair.    Objective:   VITALS:   Vitals:   05/13/16 0440 05/13/16 1444 05/13/16 2300 05/14/16 0415  BP: 127/67 118/72 125/70 132/65  Pulse: 70 75  82 65  Resp: 15 16 17 18   Temp: 99 F (37.2 C) 97.4 F (36.3 C) 98.4 F (36.9 C) 98.4 F (36.9 C)  TempSrc: Oral Oral Oral Oral  SpO2: 96% 99% 98% 100%  Weight:      Height:       CBC Latest Ref Rng & Units 05/14/2016 05/13/2016 05/12/2016  WBC 4.0 - 10.5 K/uL 7.6 7.9 -  Hemoglobin 13.0 - 17.0 g/dL 8.2(L) 9.0(L) 13.3  Hematocrit 39.0 - 52.0 % 23.9(L) 26.3(L) 39.0  Platelets 150 - 400 K/uL PENDING 128(L) -   BMP Latest Ref Rng & Units 05/14/2016 05/12/2016 05/12/2016  Glucose 65 - 99 mg/dL 161(W115(H) 960(A149(H) 540(J155(H)  BUN 6 - 20 mg/dL 5(L) 14 12  Creatinine 0.61 - 1.24 mg/dL 8.110.84 9.141.10 7.821.14  Sodium 135 - 145 mmol/L 138 140 138  Potassium 3.5 - 5.1 mmol/L 3.6 3.7 3.6  Chloride 101 - 111 mmol/L 103 103 105  CO2 22 - 32 mmol/L 30 - 24  Calcium 8.9 - 10.3 mg/dL 7.7(L) - 8.8(L)   Intake/Output      05/09 0701 - 05/10 0700 05/10 0701 - 05/11 0700   P.O. 480    I.V. (mL/kg)     Blood     Other     IV Piggyback     Total Intake(mL/kg) 480 (5.9)    Urine (mL/kg/hr) 1300 (0.7)    Emesis/NG output     Drains 5 (0)    Other     Blood  Total Output 1305     Net -825            Physical Exam: General: NAD.  Supine in bed. Pleasant, conversant. Wife at bedside Resp: No increased wob Cardio: regular rate and rhythm ABD flat, soft Neurologically intact  MSK Left LE: Compartments soft, foot warm and swollen with moderate tenderness to palpation midfoot / metatarsals. EHL, FHL, dorsiflexion, plantarflexion all intact.  Incision c/d/I .  Vac removed. Right LE: Pain with range of motion. Pelvis stable. Right knee mildly tender to palpation without ecchymosis. Mild if any effusion. Laxity with valgus stress although difficult to compare to opposite/operative side. Left UE: Pain with range of motion which is significantly limited. Nontender to palpation. No ecchymosis.     Eric Kern Martensen III, PA-C 05/14/2016, 7:35 AM
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
## 2016-05-15 ENCOUNTER — Inpatient Hospital Stay (HOSPITAL_COMMUNITY)
Admission: RE | Admit: 2016-05-15 | Discharge: 2016-05-29 | DRG: 560 | Disposition: A | Discharge status: Home Health | Payer: BC Managed Care – PPO | Source: Intra-hospital | Attending: Physical Medicine & Rehabilitation | Admitting: Physical Medicine & Rehabilitation

## 2016-05-15 ENCOUNTER — Encounter (HOSPITAL_COMMUNITY): Payer: Self-pay

## 2016-05-15 DIAGNOSIS — S46002S Unspecified injury of muscle(s) and tendon(s) of the rotator cuff of left shoulder, sequela: Secondary | ICD-10-CM

## 2016-05-15 DIAGNOSIS — S46002D Unspecified injury of muscle(s) and tendon(s) of the rotator cuff of left shoulder, subsequent encounter: Secondary | ICD-10-CM

## 2016-05-15 DIAGNOSIS — S92302D Fracture of unspecified metatarsal bone(s), left foot, subsequent encounter for fracture with routine healing: Secondary | ICD-10-CM

## 2016-05-15 DIAGNOSIS — S82202S Unspecified fracture of shaft of left tibia, sequela: Secondary | ICD-10-CM | POA: Diagnosis not present

## 2016-05-15 DIAGNOSIS — R52 Pain, unspecified: Secondary | ICD-10-CM | POA: Diagnosis not present

## 2016-05-15 DIAGNOSIS — S32409D Unspecified fracture of unspecified acetabulum, subsequent encounter for fracture with routine healing: Secondary | ICD-10-CM | POA: Diagnosis not present

## 2016-05-15 DIAGNOSIS — S3282XS Multiple fractures of pelvis without disruption of pelvic ring, sequela: Secondary | ICD-10-CM

## 2016-05-15 DIAGNOSIS — T1490XA Injury, unspecified, initial encounter: Secondary | ICD-10-CM | POA: Diagnosis present

## 2016-05-15 DIAGNOSIS — S3022XD Contusion of scrotum and testes, subsequent encounter: Secondary | ICD-10-CM | POA: Diagnosis not present

## 2016-05-15 DIAGNOSIS — Z885 Allergy status to narcotic agent status: Secondary | ICD-10-CM | POA: Diagnosis not present

## 2016-05-15 DIAGNOSIS — R55 Syncope and collapse: Secondary | ICD-10-CM | POA: Diagnosis not present

## 2016-05-15 DIAGNOSIS — S82402S Unspecified fracture of shaft of left fibula, sequela: Secondary | ICD-10-CM | POA: Diagnosis not present

## 2016-05-15 DIAGNOSIS — D62 Acute posthemorrhagic anemia: Secondary | ICD-10-CM | POA: Diagnosis present

## 2016-05-15 DIAGNOSIS — S92302S Fracture of unspecified metatarsal bone(s), left foot, sequela: Secondary | ICD-10-CM | POA: Diagnosis not present

## 2016-05-15 DIAGNOSIS — M25061 Hemarthrosis, right knee: Secondary | ICD-10-CM | POA: Diagnosis present

## 2016-05-15 DIAGNOSIS — S82402E Unspecified fracture of shaft of left fibula, subsequent encounter for open fracture type I or II with routine healing: Secondary | ICD-10-CM | POA: Diagnosis not present

## 2016-05-15 DIAGNOSIS — S7011XD Contusion of right thigh, subsequent encounter: Secondary | ICD-10-CM | POA: Diagnosis not present

## 2016-05-15 DIAGNOSIS — S3282XA Multiple fractures of pelvis without disruption of pelvic ring, initial encounter for closed fracture: Secondary | ICD-10-CM | POA: Diagnosis present

## 2016-05-15 DIAGNOSIS — S46002A Unspecified injury of muscle(s) and tendon(s) of the rotator cuff of left shoulder, initial encounter: Secondary | ICD-10-CM

## 2016-05-15 DIAGNOSIS — D696 Thrombocytopenia, unspecified: Secondary | ICD-10-CM | POA: Diagnosis present

## 2016-05-15 DIAGNOSIS — S82202B Unspecified fracture of shaft of left tibia, initial encounter for open fracture type I or II: Secondary | ICD-10-CM

## 2016-05-15 DIAGNOSIS — S82202E Unspecified fracture of shaft of left tibia, subsequent encounter for open fracture type I or II with routine healing: Secondary | ICD-10-CM | POA: Diagnosis present

## 2016-05-15 DIAGNOSIS — R609 Edema, unspecified: Secondary | ICD-10-CM | POA: Diagnosis not present

## 2016-05-15 DIAGNOSIS — K5900 Constipation, unspecified: Secondary | ICD-10-CM

## 2016-05-15 DIAGNOSIS — M25562 Pain in left knee: Secondary | ICD-10-CM | POA: Diagnosis not present

## 2016-05-15 LAB — CBC
HEMATOCRIT: 23.5 % — AB (ref 39.0–52.0)
HEMOGLOBIN: 8.1 g/dL — AB (ref 13.0–17.0)
MCH: 32 pg (ref 26.0–34.0)
MCHC: 34.5 g/dL (ref 30.0–36.0)
MCV: 92.9 fL (ref 78.0–100.0)
Platelets: 119 10*3/uL — ABNORMAL LOW (ref 150–400)
RBC: 2.53 MIL/uL — AB (ref 4.22–5.81)
RDW: 12.4 % (ref 11.5–15.5)
WBC: 7.4 10*3/uL (ref 4.0–10.5)

## 2016-05-15 MED ORDER — ENOXAPARIN SODIUM 40 MG/0.4ML ~~LOC~~ SOLN
40.0000 mg | SUBCUTANEOUS | Status: DC
  Administered 2016-05-16 – 2016-05-29 (×14): 40 mg via SUBCUTANEOUS
  Filled 2016-05-15 (×14): qty 0.4

## 2016-05-15 MED ORDER — FERROUS SULFATE 325 (65 FE) MG PO TABS
325.0000 mg | ORAL_TABLET | Freq: Two times a day (BID) | ORAL | Status: DC
  Administered 2016-05-15 – 2016-05-21 (×12): 325 mg via ORAL
  Filled 2016-05-15 (×16): qty 1

## 2016-05-15 MED ORDER — BISACODYL 10 MG RE SUPP
10.0000 mg | Freq: Every day | RECTAL | Status: DC | PRN
  Administered 2016-05-16: 10 mg via RECTAL
  Filled 2016-05-15: qty 1

## 2016-05-15 MED ORDER — ALUM & MAG HYDROXIDE-SIMETH 200-200-20 MG/5ML PO SUSP
30.0000 mL | ORAL | Status: DC | PRN
  Administered 2016-05-16 – 2016-05-17 (×2): 30 mL via ORAL
  Filled 2016-05-15 (×2): qty 30

## 2016-05-15 MED ORDER — PROCHLORPERAZINE EDISYLATE 5 MG/ML IJ SOLN: 5.0000 mg | Freq: Four times a day (QID) | INTRAMUSCULAR | Status: DC | PRN

## 2016-05-15 MED ORDER — DIPHENHYDRAMINE HCL 12.5 MG/5ML PO ELIX: 12.5000 mg | ORAL_SOLUTION | ORAL | Status: DC | PRN

## 2016-05-15 MED ORDER — TRAMADOL HCL 50 MG PO TABS
50.0000 mg | ORAL_TABLET | Freq: Four times a day (QID) | ORAL | Status: DC | PRN
  Administered 2016-05-15 – 2016-05-29 (×31): 50 mg via ORAL
  Filled 2016-05-15 (×32): qty 1

## 2016-05-15 MED ORDER — GUAIFENESIN-DM 100-10 MG/5ML PO SYRP: 5.0000 mL | ORAL_SOLUTION | Freq: Four times a day (QID) | ORAL | Status: DC | PRN

## 2016-05-15 MED ORDER — ACETAMINOPHEN 325 MG PO TABS
325.0000 mg | ORAL_TABLET | ORAL | Status: DC | PRN
  Administered 2016-05-15 – 2016-05-27 (×18): 650 mg via ORAL
  Filled 2016-05-15 (×20): qty 2

## 2016-05-15 MED ORDER — POLYETHYLENE GLYCOL 3350 17 G PO PACK
17.0000 g | PACK | Freq: Two times a day (BID) | ORAL | Status: DC
  Administered 2016-05-15 – 2016-05-20 (×10): 17 g via ORAL
  Filled 2016-05-15 (×12): qty 1

## 2016-05-15 MED ORDER — METHOCARBAMOL 500 MG PO TABS
500.0000 mg | ORAL_TABLET | Freq: Four times a day (QID) | ORAL | Status: DC | PRN
  Administered 2016-05-15 – 2016-05-27 (×24): 500 mg via ORAL
  Filled 2016-05-15 (×27): qty 1

## 2016-05-15 MED ORDER — POLYETHYLENE GLYCOL 3350 17 G PO PACK
17.0000 g | PACK | Freq: Every day | ORAL | Status: DC | PRN
  Administered 2016-05-26: 17 g via ORAL
  Filled 2016-05-15 (×2): qty 1

## 2016-05-15 MED ORDER — HYDROCODONE-ACETAMINOPHEN 5-325 MG PO TABS
1.0000 | ORAL_TABLET | ORAL | Status: DC | PRN
  Administered 2016-05-18 (×2): 1 via ORAL
  Administered 2016-05-19 (×2): 2 via ORAL
  Administered 2016-05-19: 1 via ORAL
  Administered 2016-05-19 – 2016-05-21 (×4): 2 via ORAL
  Administered 2016-05-21 (×2): 1 via ORAL
  Administered 2016-05-22 (×3): 2 via ORAL
  Administered 2016-05-23: 1 via ORAL
  Administered 2016-05-23 – 2016-05-26 (×10): 2 via ORAL
  Administered 2016-05-26: 1 via ORAL
  Administered 2016-05-26 – 2016-05-27 (×4): 2 via ORAL
  Administered 2016-05-28 (×2): 1 via ORAL
  Administered 2016-05-28: 2 via ORAL
  Administered 2016-05-28: 1 via ORAL
  Administered 2016-05-29: 2 via ORAL
  Filled 2016-05-15: qty 1
  Filled 2016-05-15 (×5): qty 2
  Filled 2016-05-15: qty 1
  Filled 2016-05-15 (×2): qty 2
  Filled 2016-05-15: qty 1
  Filled 2016-05-15 (×6): qty 2
  Filled 2016-05-15 (×2): qty 1
  Filled 2016-05-15 (×3): qty 2
  Filled 2016-05-15: qty 1
  Filled 2016-05-15 (×5): qty 2
  Filled 2016-05-15: qty 1
  Filled 2016-05-15 (×7): qty 2
  Filled 2016-05-15: qty 1
  Filled 2016-05-15 (×3): qty 2

## 2016-05-15 MED ORDER — HYDROCODONE-ACETAMINOPHEN 5-325 MG PO TABS: 1.0000 | ORAL_TABLET | ORAL | Status: DC | PRN

## 2016-05-15 MED ORDER — TRAZODONE HCL 50 MG PO TABS
25.0000 mg | ORAL_TABLET | Freq: Every evening | ORAL | Status: DC | PRN
Start: 2016-05-15 — End: 2016-05-29
  Administered 2016-05-22 – 2016-05-23 (×2): 25 mg via ORAL
  Administered 2016-05-24 – 2016-05-28 (×5): 50 mg via ORAL
  Filled 2016-05-15 (×7): qty 1

## 2016-05-15 MED ORDER — PROCHLORPERAZINE 25 MG RE SUPP: 12.5000 mg | Freq: Four times a day (QID) | RECTAL | Status: DC | PRN

## 2016-05-15 MED ORDER — PROCHLORPERAZINE MALEATE 5 MG PO TABS
5.0000 mg | ORAL_TABLET | Freq: Four times a day (QID) | ORAL | Status: DC | PRN
  Administered 2016-05-17 (×2): 5 mg via ORAL
  Filled 2016-05-15 (×2): qty 1

## 2016-05-15 MED ORDER — FLEET ENEMA 7-19 GM/118ML RE ENEM: 1.0000 | ENEMA | Freq: Once | RECTAL | Status: DC | PRN

## 2016-05-15 MED ORDER — HYDROCODONE-ACETAMINOPHEN 5-325 MG PO TABS: 1.0000 | ORAL_TABLET | ORAL | 0 refills | Status: DC | PRN

## 2016-05-15 MED ORDER — DIPHENHYDRAMINE HCL 12.5 MG/5ML PO ELIX: 12.5000 mg | ORAL_SOLUTION | Freq: Four times a day (QID) | ORAL | Status: DC | PRN

## 2016-05-15 NOTE — H&P (Deleted)
  The note originally documented on this encounter has been moved the the encounter in which it belongs.  
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
## 2016-05-15 NOTE — Progress Notes (Signed)
PT Cancellation Note  Patient Details Name: Adine MaduraMark Uehara MRN: [id number] DOB: 09/01/1960   Cancelled Treatment:    Reason Eval/Treat Not Completed: Other (comment) RN and NT in room to transfer to CIR upon entry.   Margot ChimesBrittany Smith, PT, DPT  Acute Rehabilitation Services  Pager: [phone]    Melvyn NovasBrittany L Smith 05/15/2016, 2:56 PM
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
## 2016-05-15 NOTE — Progress Notes (Signed)
Inpatient Rehabilitation  I have insurance authorization, medical clearance, and a bed to offer patient today.  Will proceed with IP Rehab admission today.  Please call with questions.   Charlane FerrettiMelissa Francille Wittmann, M.A., CCC/SLP Admission Coordinator  [facility]  Cell [phone]
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
## 2016-05-15 NOTE — Progress Notes (Signed)
Pt arrived to floor via bed, pt is A+O x 4, able to make needs known, pain 6/10, prn medication administered.  Partner at bedside.

## 2016-05-15 NOTE — Progress Notes (Signed)
Assessment / Plan:: 3 Days Post-Op  S/P Procedure(s) (LRB): OPEN REDUCTION INTERNAL FIXATION (ORIF) TIBIA FRACTURE (Left) by Dr. Jewel Baize. Eric Schmitt on 05/12/16  Principal Problem:   Open fracture of left tibia Active Problems:   Open left tibial fracture   Multiple closed fractures of metatarsal bone of left foot   Scrotal hematoma   Multiple closed fractures of pelvis without disruption of pelvic ring (HCC)   Acute blood loss anemia   Marked improvement.  Pain controlled.  Starting to [facility].  Hgb low but stable. Plan for transfer to CIR.  Type II Open Left tibia fracture  -Pain much improved.  Controlled w/ Tylenol and intermittent pain medication.   -Change oxy to norco dt n/v. -WBAT. D/C vac dressing Elevate and apply ice.  Acute Blood Loss Anemia -Likely w/ some dilutional component  -Hgb 8.1 < 8.2<<13.3. Symptomatic, but stable.  Patient would like to Continue to follow w/o transfusion.  Closed Right inf pubic ramus / acet fx -Non-op management. -Much better movement. -WBAT. Mobilize w/ PT  Left Closed Nondisplaced Metatarsal Fxs -Swelling improving. -Non-operative management -WBAT in Boot LLE  Scrotal contusion -Improving. -Urinating.  Did require I/O post op. -per trauma.  Apply ice prn.   Left Shoulder pain and limited ROM -Improving.  Consider MRI - likely as outpatient.  Right knee pain -Improving. XR Negative.  Some laxity w/ varus stress.  Minimal effusion, no bruising.   -Knee immobilizer if unstable when ambulating.  Consider MRI. - likely as outpatient.  Up with therapy Incentive Spirometry Elevate and apply ice  Weight Bearing: Weight Bearing as Tolerated (WBAT) BLE Dressings: Reinforce Prn.  Continue vac.  VTE prophylaxis: Lovenox, SCDs, ambulation Dispo: CIR   Subjective: Patient reports pain as mild. Pain controlled with PO meds.  Tolerating diet.  +Flatus.  No BM.  Urinating.  No CP, SOB.  OOB to standing and to chair.     Objective:   VITALS:   Vitals:   05/14/16 0415 05/14/16 1536 05/14/16 2102 05/15/16 0652  BP: 132/65 118/67 115/67 127/70  Pulse: 65 74 72 70  Resp: 18 17 16 16   Temp: 98.4 F (36.9 C) 98.4 F (36.9 C) 98.2 F (36.8 C) 98.4 F (36.9 C)  TempSrc: Oral Oral Oral Oral  SpO2: 100% 99% 98% 98%  Weight:      Height:       CBC Latest Ref Rng & Units 05/15/2016 05/14/2016 05/13/2016  WBC 4.0 - 10.5 K/uL 7.4 7.6 7.9  Hemoglobin 13.0 - 17.0 g/dL 8.1(L) 8.2(L) 9.0(L)  Hematocrit 39.0 - 52.0 % 23.5(L) 23.9(L) 26.3(L)  Platelets 150 - 400 K/uL 119(L) 115(L) 128(L)   BMP Latest Ref Rng & Units 05/14/2016 05/12/2016 05/12/2016  Glucose 65 - 99 mg/dL 161(W) 960(A) 540(J)  BUN 6 - 20 mg/dL 5(L) 14 12  Creatinine 0.61 - 1.24 mg/dL 8.11 9.14 7.82  Sodium 135 - 145 mmol/L 138 140 138  Potassium 3.5 - 5.1 mmol/L 3.6 3.7 3.6  Chloride 101 - 111 mmol/L 103 103 105  CO2 22 - 32 mmol/L 30 - 24  Calcium 8.9 - 10.3 mg/dL 7.7(L) - 8.8(L)   Intake/Output      05/10 0701 - 05/11 0700 05/11 0701 - 05/12 0700   P.O.     Total Intake(mL/kg)     Urine (mL/kg/hr) 900 (0.5)    Drains     Total Output 900     Net -900  Physical Exam: General: NAD.  Supine in bed. Pleasant, conversant.  Resp: No increased wob Cardio: regular rate and rhythm ABD flat, soft Neurologically intact  MSK Left LE: Compartments soft, foot warm and less swollen with moderate tenderness to palpation midfoot / metatarsals. EHL, FHL, dorsiflexion, plantarflexion all intact.  Dressings c/d/I .  Right LE: Pain mild w/ range of motion. Pelvis stable. Right knee mildly tender to palpation without ecchymosis. Mild if any effusion. Laxity with valgus stress although difficult to compare to opposite/operative side. Left UE: Pain with range of motion limited. Nontender to palpation. No ecchymosis.     Lucretia KernHenry Calvin Martensen III, PA-C 05/15/2016, 7:06 AM
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
## 2016-05-16 ENCOUNTER — Inpatient Hospital Stay (HOSPITAL_COMMUNITY): Payer: BC Managed Care – PPO | Admitting: Physical Therapy

## 2016-05-16 ENCOUNTER — Inpatient Hospital Stay (HOSPITAL_COMMUNITY): Payer: BC Managed Care – PPO | Admitting: Occupational Therapy

## 2016-05-16 LAB — CBC WITH DIFFERENTIAL/PLATELET
Basophils Absolute: 0 10*3/uL (ref 0.0–0.1)
Basophils Relative: 0 %
EOS PCT: 1 %
Eosinophils Absolute: 0.1 10*3/uL (ref 0.0–0.7)
HCT: 23.6 % — ABNORMAL LOW (ref 39.0–52.0)
Hemoglobin: 8 g/dL — ABNORMAL LOW (ref 13.0–17.0)
LYMPHS ABS: 1 10*3/uL (ref 0.7–4.0)
LYMPHS PCT: 14 %
MCH: 31.1 pg (ref 26.0–34.0)
MCHC: 33.9 g/dL (ref 30.0–36.0)
MCV: 91.8 fL (ref 78.0–100.0)
MONO ABS: 0.4 10*3/uL (ref 0.1–1.0)
Monocytes Relative: 5 %
Neutro Abs: 5.8 10*3/uL (ref 1.7–7.7)
Neutrophils Relative %: 80 %
PLATELETS: 148 10*3/uL — AB (ref 150–400)
RBC: 2.57 MIL/uL — ABNORMAL LOW (ref 4.22–5.81)
RDW: 12.4 % (ref 11.5–15.5)
WBC: 7.3 10*3/uL (ref 4.0–10.5)

## 2016-05-16 LAB — COMPREHENSIVE METABOLIC PANEL
ALT: 22 U/L (ref 17–63)
AST: 57 U/L — ABNORMAL HIGH (ref 15–41)
Albumin: 2.8 g/dL — ABNORMAL LOW (ref 3.5–5.0)
Alkaline Phosphatase: 36 U/L — ABNORMAL LOW (ref 38–126)
Anion gap: 7 (ref 5–15)
BUN: 7 mg/dL (ref 6–20)
CALCIUM: 8.1 mg/dL — AB (ref 8.9–10.3)
CO2: 28 mmol/L (ref 22–32)
Chloride: 102 mmol/L (ref 101–111)
Creatinine, Ser: 0.77 mg/dL (ref 0.61–1.24)
GFR calc Af Amer: >60 mL/min (ref >60)
GLUCOSE: 120 mg/dL — AB (ref 65–99)
Potassium: 4 mmol/L (ref 3.5–5.1)
SODIUM: 137 mmol/L (ref 135–145)
TOTAL PROTEIN: 5.5 g/dL — AB (ref 6.5–8.1)
Total Bilirubin: 0.7 mg/dL (ref 0.3–1.2)

## 2016-05-16 MED ORDER — FLEET ENEMA 7-19 GM/118ML RE ENEM: 1.0000 | ENEMA | Freq: Once | RECTAL | Status: DC

## 2016-05-16 MED ORDER — SORBITOL 70 % SOLN: 60.0000 mL | Freq: Once | Status: DC

## 2016-05-16 MED ORDER — BISACODYL 10 MG RE SUPP: 10.0000 mg | Freq: Once | RECTAL | Status: DC
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
## 2016-05-16 NOTE — Plan of Care (Signed)
Problem: RH BOWEL ELIMINATION Goal: RH STG MANAGE BOWEL WITH ASSISTANCE STG Manage Bowel with Min Assistance. Outcome: Not Progressing No BM since addmission

## 2016-05-16 NOTE — Progress Notes (Signed)
[id number] 05/16/16 nursing RN was called to room per PT patient passed out for 2 mins bp at the time 52/41 when they stood up. BP recheck 127/78. Patient is comfortable now. MD notified he said to place ted hose on rt leg and push fluids and sit patient first before standing.

## 2016-05-17 ENCOUNTER — Inpatient Hospital Stay (HOSPITAL_COMMUNITY): Payer: BC Managed Care – PPO

## 2016-05-17 ENCOUNTER — Inpatient Hospital Stay (HOSPITAL_COMMUNITY): Payer: BC Managed Care – PPO | Admitting: Physical Therapy

## 2016-05-17 DIAGNOSIS — R55 Syncope and collapse: Secondary | ICD-10-CM

## 2016-05-17 DIAGNOSIS — R609 Edema, unspecified: Secondary | ICD-10-CM

## 2016-05-17 NOTE — Progress Notes (Signed)
*  PRELIMINARY RESULTS* Vascular Ultrasound Bilateral lower extremity venous duplex has been completed.  Preliminary findings: No evidence of deep vein thrombosis in the visualized veins of the lower extremities.  Limited evaluation of the left lower extremity due to bandages and poor patient positioning.  Negative for baker's cysts.  Eric FischerCharlotte C Nazia Schmitt 05/17/2016, 3:20 PM

## 2016-05-17 NOTE — Progress Notes (Signed)
Physical Therapy Session Note  Patient Details  Name: Eric Schmitt MRN: [id number] Date of Birth: 08/19/1960  Today's Date: 05/17/2016 PT Individual Time: [id number] PT Individual Time Calculation (min): 41 min   Short Term Goals: Week 1:  PT Short Term Goal 1 (Week 1): Pt will perform bed mobility with min assist  PT Short Term Goal 2 (Week 1): Pt will transfer with min assist and LRAD PT Short Term Goal 3 (Week 1): Pt will maintain standing position without significant change in BP x 5 minutes  PT Short Term Goal 4 (Week 1): Pt will ambulate 315ft with mod assist and LRAD  PT Short Term Goal 5 (Week 1): Pt will propell WC x 13650ft and min assist from PT.   Skilled Therapeutic Interventions/Progress Updates:  Pt received in bed with family member present & agreeable to tx. Pt's mobility limited by pain, that increased with movement, and orthostatic hypotension. Pt with R ted hose & L CAM boot donned. Pt transferred supine<>sitting EOB with +2 assistance with therapist supporting LLE as pt with increasing pain with limb in dependent position. Pt's vitals assessed throughout session, please see below. Pt without symptoms of hypotension until he transferred to standing then pt quickly reported need to sit and "I could feel things closing in". Pt required +2 assist to return to supine in bed and [facility] but unable to stand pivot; at end of session provided pt with drop-arm BSC to allow pt to practice lateral scoot or squat [facility]. Throughout session pt encouraged to attempt to move LLE on his own then allow therapist to assist; pt appears to have decreased pain with movement when doing this. At end of session pt left in bed with all needs within reach, BLE elevated on pillows, bed alarm set, & family member present to supervise.   Therapy Documentation Precautions:  Precautions Precautions: Fall Required Braces or Orthoses: Other  Brace/Splint Other Brace/Splint: Donned cam walker on LLE Restrictions Weight Bearing Restrictions: Yes RLE Weight Bearing: Weight bearing as tolerated LLE Weight Bearing: Weight bearing as tolerated  Vital Signs: Supine in bed: 137/87 mmHg Sitting EOB immediately: 105/71 mmHg Sitting after ~3 minutes: 119/68 mmHg Sitting on elevated EOB: 127/68 mmHg Unable to assess standing 2/2 pt symptomatic and returned to supine in bed. Supine in bed: 146/93 mmHg  Pain: 3/10 pain that increased to 8/10 with movement - RN made aware & pain meds administered.   See Function Navigator for Current Functional Status.   Therapy/Group: Individual Therapy  Sandi MariscalVictoria M Kaydence Baba 05/17/2016, 3:49 PM
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
## 2016-05-18 ENCOUNTER — Inpatient Hospital Stay (HOSPITAL_COMMUNITY): Payer: BC Managed Care – PPO

## 2016-05-18 ENCOUNTER — Inpatient Hospital Stay (HOSPITAL_COMMUNITY): Payer: BC Managed Care – PPO | Admitting: Physical Therapy

## 2016-05-18 LAB — CBC
HEMATOCRIT: 25.9 % — AB (ref 39.0–52.0)
HEMOGLOBIN: 9 g/dL — AB (ref 13.0–17.0)
MCH: 32.5 pg (ref 26.0–34.0)
MCHC: 34.7 g/dL (ref 30.0–36.0)
MCV: 93.5 fL (ref 78.0–100.0)
Platelets: 255 10*3/uL (ref 150–400)
RBC: 2.77 MIL/uL — AB (ref 4.22–5.81)
RDW: 13 % (ref 11.5–15.5)
WBC: 10 10*3/uL (ref 4.0–10.5)
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
## 2016-05-18 NOTE — Progress Notes (Signed)
Patient information reviewed and entered into eRehab system by Joyel Chenette, RN, CRRN, PPS Coordinator.  Information including medical coding and functional independence measure will be reviewed and updated through discharge.    

## 2016-05-18 NOTE — Progress Notes (Signed)
Physical Therapy Session Note  Patient Details  Name: Eric MaduraMark Schmitt MRN: [id number] Date of Birth: 12/23/1960  Today's Date: 05/18/2016 PT Individual Time: 0845-1000 PT Individual Time Calculation (min): 75 min   Short Term Goals: Week 1:  PT Short Term Goal 1 (Week 1): Pt will perform bed mobility with min assist  PT Short Term Goal 2 (Week 1): Pt will transfer with min assist and LRAD PT Short Term Goal 3 (Week 1): Pt will maintain standing position without significant change in BP x 5 minutes  PT Short Term Goal 4 (Week 1): Pt will ambulate 3615ft with mod assist and LRAD  PT Short Term Goal 5 (Week 1): Pt will propell WC x 110550ft and min assist from PT.   Skilled Therapeutic Interventions/Progress Updates:    Session focused on addressing functional bed mobility, scoot/squat pivot transfers, education, w/c propulsion for mobility and strengthening/endurance and upright tolerance. Tedhose applied to RLE prior to OOB to aid with BP management and maintained slow movements during transitional movements throughout session with cues for pursed lip breathing. BP values below and monitored throughout. Pt able to complete supine -> sit EOB with min assist for management of LLE and extra time - pt using hospital bed features to assist. Maintained balance EOB supervision to allow for time for BP to stabilize while PT assisted with maintaining LLE somewhat elevated for tolerance. Pt completed scoot/squat pivot transfer x 2 in session (bed -> w/c and w/c -> recliner) with overall min assist for assist with movement and placement of LLE during transfer and significant amount of extra time. Pt appears to have increase in symptoms of nausea, pain, and overall "not feeling well" once in warm gym and pain level higher - BP stable during this. Discussed possibility of anxiety, pain, and heat being a factor contributing to these symptoms. Appeared to stabilize upon return to his room. Transferred in recliner as  described above and positioned LLE in elevated position with pillows. All needs in reach. Discussed the above with RN and primary OT.  In bed with HOB elevated: 129/82 mmHg EOB 96/70 mmHg - mildly symptomatic EOB x 5 min 91/67 mmHg- asymtomatic In w/c after transfer 110/74 mmHg In w/c in gym x 15-20 min later 120/79 mmHg; c/o nausea and feeling hot from temperature in room and increased pain levels   Therapy Documentation Precautions:  Precautions Precautions: Fall Required Braces or Orthoses: Other Brace/Splint Other Brace/Splint: Donned cam walker on LLE Restrictions Weight Bearing Restrictions: Yes RLE Weight Bearing: Weight bearing as tolerated LLE Weight Bearing: Weight bearing as tolerated  Pain: 6/10 at rest and 9/10 with mobility in LLE. RN notified for pain medication   See Function Navigator for Current Functional Status.   Therapy/Group: Individual Therapy  Karolee StampsGray, Daviel Allegretto Darrol PokeBrescia  Malasha Kleppe B. Macguire Holsinger, PT, DPT  05/18/2016, 10:06 AM

## 2016-05-18 NOTE — Progress Notes (Signed)
Occupational Therapy Session Note  Patient Details  Name: Eric Schmitt MRN: [id number] Date of Birth: 05/22/1960  Today's Date: 05/18/2016 OT Individual Time: 1100-1200 OT Individual Time Calculation (min): 60 min    Short Term Goals: Week 1:  OT Short Term Goal 1 (Week 1): Pt will complete toilet transfer with min A OT Short Term Goal 2 (Week 1): Pt will don pants with min A OT Short Term Goal 3 (Week 1): Pt will complete sit<>stand with Min A in preparation for ADL tasks  Skilled Therapeutic Interventions/Progress Updates:    Pt resting in recliner upon arrival.  Pt engaged in BADL retraining at recliner level.  Pt used lateral leans to doff/don shorts.  Pt given reacher to assist with threading pants over LLE.  Pt noted with increased pain with all movement of LLE.  Discussed discharge plans and LTGs.  discussed probable equipment needs.  Pt completed all tasks at min A level but declined attempting to stand secondary to anxiety about prior orthostatic episodes.    Therapy Documentation Precautions:  Precautions Precautions: Fall Required Braces or Orthoses: Other Brace/Splint Other Brace/Splint: Donned cam walker on LLE Restrictions Weight Bearing Restrictions: Yes RLE Weight Bearing: Weight bearing as tolerated LLE Weight Bearing: Weight bearing as tolerated   Pain: Pain Assessment Pain Assessment: 0-10 Pain Score: 7  Pain Type: Acute pain Pain Location: Leg Pain Orientation: Left Pain Descriptors / Indicators: Aching Pain Frequency: Constant Pain Onset: With Activity Pain Intervention(s): pre medicated  See Function Navigator for Current Functional Status.   Therapy/Group: Individual Therapy  Rich BraveLanier, [name] 05/18/2016, 12:12 PM

## 2016-05-18 NOTE — Progress Notes (Signed)
Physical Therapy Session Note  Patient Details  Name: Eric Schmitt MRN: [id number] Date of Birth: 10-Feb-1960  Today's Date: 05/18/2016 PT Individual Time: 7366-8159 PT Individual Time Calculation (min): 64 min   Short Term Goals: Week 1:  PT Short Term Goal 1 (Week 1): Pt will perform bed mobility with min assist  PT Short Term Goal 2 (Week 1): Pt will transfer with min assist and LRAD PT Short Term Goal 3 (Week 1): Pt will maintain standing position without significant change in BP x 5 minutes  PT Short Term Goal 4 (Week 1): Pt will ambulate 31f with mod assist and LRAD  PT Short Term Goal 5 (Week 1): Pt will propell WC x 1559fand min assist from PT.   Skilled Therapeutic Interventions/Progress Updates:    c/o 9/10 pain, RN in to medicate during session with minimal relief.  Session focus on activity tolerance and strengthening with transfers and w/c mobility.   Pt requires increased time and max assist to don L cam boot.  Increased pain with LLE in dependent position.  Sit<>stand x2 throughout session with min/mod assist and verbal cues for sequencing and hand placement.  BP assessed in sitting (120/80) and standing (113/72) and pt with increase in pain requiring him to return to seated position.  Lateral scoot transfer to w/c with assist for managing LLE.  W/C propulsion throughout unit, max distance 100', with BUEs and pt reporting fatigue/pain in L shoulder.  Attempted propelling with R hemi technique but pt reporting no improvement in comfort.  PT instructed pt in chair push ups x5, again with L shoulder pain.  Provided pt with  Level 3 theraband for RUE and level 2 theraband for LUE attached to w/c for pt to complete UE therex targeting pecs, deltoids, triceps, and biceps when not in therapy.  Pt returned to room at end of session and performed stand/pivot back to bed with min assist and increased time with verbal cues for backing up to bed before attempting to sit.  Sit>supine with mod  assist for LEs and pt positioned to comfort with call bell in reach and needs met.   Therapy Documentation Precautions:  Precautions Precautions: Fall Required Braces or Orthoses: Other Brace/Splint Other Brace/Splint: Donned cam walker on LLE Restrictions Weight Bearing Restrictions: Yes RLE Weight Bearing: Weight bearing as tolerated LLE Weight Bearing: Weight bearing as tolerated   See Function Navigator for Current Functional Status.   Therapy/Group: Individual Therapy  CaEarnest Conroyenven-Crew 05/18/2016, 4:45 PM
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
## 2016-05-19 ENCOUNTER — Inpatient Hospital Stay (HOSPITAL_COMMUNITY): Payer: BC Managed Care – PPO | Admitting: Occupational Therapy

## 2016-05-19 ENCOUNTER — Inpatient Hospital Stay (HOSPITAL_COMMUNITY): Payer: BC Managed Care – PPO | Admitting: Physical Therapy

## 2016-05-19 ENCOUNTER — Inpatient Hospital Stay (HOSPITAL_COMMUNITY): Payer: BC Managed Care – PPO

## 2016-05-19 DIAGNOSIS — M25562 Pain in left knee: Secondary | ICD-10-CM

## 2016-05-19 NOTE — Progress Notes (Signed)
Occupational Therapy Session Note  Patient Details  Name: Eric MaduraMark Schmitt MRN: [id number] Date of Birth: 07/17/1960  Today's Date: 05/19/2016 OT Individual Time: 1300-1330 OT Individual Time Calculation (min): 30 min    Short Term Goals: Week 1:  OT Short Term Goal 1 (Week 1): Pt will complete toilet transfer with min A OT Short Term Goal 2 (Week 1): Pt will don pants with min A OT Short Term Goal 3 (Week 1): Pt will complete sit<>stand with Min A in preparation for ADL tasks  Skilled Therapeutic Interventions/Progress Updates:    Pt worked on Environmental health practitionerdonning cam boot from bedside chair with mod assist.  He was able to fasten all the velcro straps but needed assistance with positioning the LLE in the boot.  He was able to donn the right shoe with supervision.  Once donned BP was taken in sitting at 114/75.  Had pt complete sit to stand with min assist and use of the RW for support in standing after 1 min BP was taken at 101/65.  After standing for 4 mins BP was taken again at 78/44.  Pt with no report of dizziness of lightheadedness but had him sit to rest anyway.  BP taken in sitting at 121/79.  Finished session with pt sitting in bedside recliner and family/visitors present.    Therapy Documentation Precautions:  Precautions Precautions: Fall Required Braces or Orthoses: Other Brace/Splint Other Brace/Splint: Donned cam walker on LLE Restrictions Weight Bearing Restrictions: No RLE Weight Bearing: Weight bearing as tolerated LLE Weight Bearing: Weight bearing as tolerated  Pain: Pain Assessment Pain Assessment: Faces Pain Score: 4  Pain Type: Surgical pain Pain Location: Leg Pain Orientation: Left Pain Descriptors / Indicators: Discomfort Pain Frequency: Intermittent Pain Onset: With Activity Patients Stated Pain Goal: 2 Pain Intervention(s): Repositioned Multiple Pain Sites: No ADL: See Function Navigator for Current Functional Status.   Therapy/Group: Individual  Therapy  Jennier Schissler OTR/L 05/19/2016, 3:45 PM

## 2016-05-19 NOTE — Progress Notes (Signed)
Physical Therapy Session Note  Patient Details  Name: Eric Schmitt MRN: [id number] Date of Birth: 05/22/1960  Today's Date: 05/19/2016 PT Individual Time: 0800-0900 PT Individual Time Calculation (min): 60 min   Short Term Goals: Week 1:  PT Short Term Goal 1 (Week 1): Pt will perform bed mobility with min assist  PT Short Term Goal 2 (Week 1): Pt will transfer with min assist and LRAD PT Short Term Goal 3 (Week 1): Pt will maintain standing position without significant change in BP x 5 minutes  PT Short Term Goal 4 (Week 1): Pt will ambulate 5915ft with mod assist and LRAD  PT Short Term Goal 5 (Week 1): Pt will propell WC x 12950ft and min assist from PT.   Skilled Therapeutic Interventions/Progress Updates:    Pt sitting BSC upon arrival and requesting assistance to return to bed. PT assisting with LLE and pt able to perform lateral scoot to bed using UEs. Dorsiflexion stretch performed and knee flexion prior to donning CAM boot. Pt educated on sheet assisted dorsiflexion stretch and knee flexion as tolerated. CAM boot donned in bed with mod assist. Lateral scoot transfer performed from bed>w/c with min assist and cues. Pt very guarded of LLE with mobility. Pt propelling w/c 110 ft in hall with supervision, requesting rest due to fatigue. In parallel bars, pt able to stand X60 seconds with encouragement before having to sit back down. Pt returned to room, up in w/c with all needs in reach. Pt did have transient episodes of feeling lightheaded, improved with rest and breathing cues.   Therapy Documentation Precautions:  Precautions Precautions: Fall Required Braces or Orthoses: Other Brace/Splint Other Brace/Splint: Donned cam walker on LLE Restrictions Weight Bearing Restrictions: Yes RLE Weight Bearing: Weight bearing as tolerated LLE Weight Bearing: Weight bearing as tolerated   Vital Signs:  BP 127/78 Pain: 3/10 at rest and 8/10 with activity. Nursing providing pain medication  during session.   See Function Navigator for Current Functional Status.   Therapy/Group: Individual Therapy  Delton SeeBenjamin Jyla Hopf, PT 05/19/2016, 12:55 PM

## 2016-05-19 NOTE — Progress Notes (Signed)
Occupational Therapy Session Note  Patient Details  Name: Eric Schmitt MRN: [id number] Date of Birth: 1960/09/18  Today's Date: 05/19/2016 OT Individual Time: 1102-1200 OT Individual Time Calculation (min): 58 min    Short Term Goals: Week 1:  OT Short Term Goal 1 (Week 1): Pt will complete toilet transfer with min A OT Short Term Goal 2 (Week 1): Pt will don pants with min A OT Short Term Goal 3 (Week 1): Pt will complete sit<>stand with Min A in preparation for ADL tasks  Skilled Therapeutic Interventions/Progress Updates:    1:1 focus on ADL retraining and stand step transfers. Pt reporting pain in LLE, however just received medication and not wanting to alert RN. Pt completes bathing/dressing from seated in w/c d/t prior orthostatic episodes. Pt admitted anxiety/fear from prior othostatic episodes. OT provided support and encouragement, and discussed OT goals regarding self care and functional mobility. Pt bathes 10/10 body parts in sitting with lateral leans to wash bottom. Pt dresses with supervision and VC for reacher technique to don pants. Pt stand step transfer with MIN A for balance A to advance LLE w/c>recliner. Exited session with pt seated in recliner with call light in reach and all needs met.  BP Sitting in w/c: 133/79 After stand step transfer 144/69  Therapy Documentation Precautions:  Precautions Precautions: Fall Required Braces or Orthoses: Other Brace/Splint Other Brace/Splint: Donned cam walker on LLE Restrictions Weight Bearing Restrictions: Yes RLE Weight Bearing: Weight bearing as tolerated LLE Weight Bearing: Weight bearing as tolerated  ADL ADL Comments: Please see functional navigator  See Function Navigator for Current Functional Status.   Therapy/Group: Individual Therapy  Tonny Branch 05/19/2016, 12:09 PM

## 2016-05-19 NOTE — Progress Notes (Signed)
Physical Therapy Session Note  Patient Details  Name: Eric Schmitt MRN: [id number] Date of Birth: 11/24/1960  Today's Date: 05/19/2016 PT Individual Time: 1000-1100 PT Individual Time Calculation (min): 60 min   Short Term Goals: Week 1:  PT Short Term Goal 1 (Week 1): Pt will perform bed mobility with min assist  PT Short Term Goal 2 (Week 1): Pt will transfer with min assist and LRAD PT Short Term Goal 3 (Week 1): Pt will maintain standing position without significant change in BP x 5 minutes  PT Short Term Goal 4 (Week 1): Pt will ambulate 8915ft with mod assist and LRAD  PT Short Term Goal 5 (Week 1): Pt will propell WC x 12250ft and min assist from PT.   Skilled Therapeutic Interventions/Progress Updates: Pt received seated in w/c, complaints of pain as below and agreeable to treatment. Wheelchair propulsion with BUE and S x150', occasional short rest breaks due to fatigue. Stand pivot transfer wheelchair> mat table with RW and min/modA due to pain with weight bearing through LLE and posterior bias. Pt reports light headedness during transfer after approximately one minute of standing due to slow speed of transfer. BP assessed after 1 min rest break at 100/82. Sit <> supine with minA for LLE management. RLE heel slides AROM 2x10 reps. Heel slides/quad set alternating x8 reps total AAROM LLE. Squat pivot transfer to return to wheelchair with close S; pt self selected squat pivot transfer due to pain and fatigue. Returned to room total assist, handoff to OT for next session.     Therapy Documentation Precautions:  Precautions Precautions: Fall Required Braces or Orthoses: Other Brace/Splint Other Brace/Splint: Donned cam walker on LLE Restrictions Weight Bearing Restrictions: Yes RLE Weight Bearing: Weight bearing as tolerated LLE Weight Bearing: Weight bearing as tolerated Pain: Pain Assessment Pain Assessment: 0-10 Pain Score: 4  Pain Type: Acute pain Pain Location: Leg Pain  Orientation: Left Pain Descriptors / Indicators: Aching Pain Frequency: Intermittent Pain Onset: On-going Patients Stated Pain Goal: 2 Pain Intervention(s): Medication (See eMAR) Multiple Pain Sites: No   See Function Navigator for Current Functional Status.   Therapy/Group: Individual Therapy  Vista Lawmanlizabeth J Tygielski 05/19/2016, 11:57 AM
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
## 2016-05-19 NOTE — Progress Notes (Signed)
[facility]patient Rehabilitation [facility] Individual Statement of Services  Patient Name:  Eric Schmitt  Date:  05/19/2016  Welcome to the [facility]patient Rehabilitation [facility].  Our goal is to provide you with an individualized program based on your diagnosis and situation, designed to meet your specific needs.  With this comprehensive rehabilitation program, you will be expected to participate in at least 3 hours of rehabilitation therapies Monday-Friday, with modified therapy programming on the weekends.  Your rehabilitation program will include the following services:  Physical Therapy (PT), Occupational Therapy (OT), 24 hour per day rehabilitation nursing, Neuropsychology, Case Management (Social Worker), Rehabilitation Medicine, Nutrition Services and Pharmacy Services  Weekly team conferences will be held on Tuesdays to discuss your progress.  Your Social Worker will talk with you frequently to get your input and to update you on team discussions.  Team conferences with you and your family in attendance may also be held.  Expected length of stay: 14 to 16 days  Overall anticipated outcome:  Supervision with minimal assistance needed for stairs  Depending on your progress and recovery, your program may change. Your Social Worker will coordinate services and will keep you informed of any changes. Your Social Worker's name and contact numbers are listed  below.  The following services may also be recommended but are not provided by the Inpatient Rehabilitation [facility]:   Driving Evaluations  Home Health Rehabiltiation Services  Outpatient Rehabilitation Services  Vocational Rehabilitation   Arrangements will be made to provide these services after discharge if needed.  Arrangements include referral to agencies that provide these services.  Your insurance has been verified to be: [employer] Your primary doctor is:  You do not have a PCP, but I will help you find one.  Pertinent  information will be shared with your doctor and your insurance company.  Social Worker:  Staci AcostaJenny Jeramie Scogin, LCSW  [phone] or ([phone]) [phone]  Information discussed with and copy given to patient by: [name], [name], 05/19/2016, 1:31 PM

## 2016-05-20 ENCOUNTER — Inpatient Hospital Stay (HOSPITAL_COMMUNITY): Payer: BC Managed Care – PPO | Admitting: Physical Therapy

## 2016-05-20 ENCOUNTER — Encounter (HOSPITAL_COMMUNITY): Payer: BC Managed Care – PPO | Admitting: Psychology

## 2016-05-20 ENCOUNTER — Inpatient Hospital Stay (HOSPITAL_COMMUNITY): Payer: BC Managed Care – PPO | Admitting: Occupational Therapy

## 2016-05-20 DIAGNOSIS — T1490XA Injury, unspecified, initial encounter: Secondary | ICD-10-CM

## 2016-05-20 DIAGNOSIS — R52 Pain, unspecified: Secondary | ICD-10-CM
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
## 2016-05-20 NOTE — Progress Notes (Signed)
Physical Therapy Session Note  Patient Details  Name: Eric Schmitt MRN: [id number] Date of Birth: 01/07/1960  Today's Date: 05/20/2016 PT Individual Time: 0900-0930 PT Individual Time Calculation (min): 30 min   Short Term Goals: Week 3:     Skilled Therapeutic Interventions/Progress Updates: Pt presented in bed with nsg wrapping dressing. PTA donned socks and CAM boot for time management. PTA supported LLE while performing supine to sit. Performed stand pivot to w/c with min guard. PTA transported pt to rehab gym for time. Performed standing tolerance x 3 min in parallel bars with PTA encouragement. Pt able to propel back to room with no rest breaks and remained in w/c at end of session with needs met.      Therapy Documentation Precautions:  Precautions Precautions: Fall Required Braces or Orthoses: Other Brace/Splint Other Brace/Splint: Donned cam walker on LLE Restrictions Weight Bearing Restrictions: Yes RLE Weight Bearing: Weight bearing as tolerated LLE Weight Bearing: Weight bearing as tolerated General:   Vital Signs:   Pain: Pain Assessment Pain Assessment: 0-10 Pain Score: 2  Pain Type: Surgical pain Pain Location: Leg Pain Descriptors / Indicators: Aching Pain Frequency: Intermittent Pain Onset: On-going Patients Stated Pain Goal: 2 Pain Intervention(s): Medication (See eMAR);Repositioned Multiple Pain Sites: No M See Function Navigator for Current Functional Status.   Therapy/Group: Individual Therapy  Emmogene Simson  Marcellina Jonsson, PTA  05/20/2016, 10:58 AM

## 2016-05-20 NOTE — Progress Notes (Signed)
Social Work Patient ID: Eric Schmitt, male   DOB: 1960/11/19, 56 y.o.   MRN: [id number]   CSW met with pt 05-19-16 to update him on team conference discussion.  Pt's older dtr, his father, and his step-mother were present and heard pt's targeted d/c date of 05-29-16.  Pt will pass this information on to his wife.  Pt will need supervision initially at d/c, so CSW will confirm with pt that someone will be with him.  He stated on assessment he would have help when he goes home.  Pt was pleased to know of d/c date and hopes he will be better than he is at present, although he reports having his best day yet.  CSW will continue to follow and assist as needed.
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
## 2016-05-20 NOTE — Progress Notes (Signed)
Occupational Therapy Session Note  Patient Details  Name: Eric Schmitt MRN: [id number] Date of Birth: 02/02/1960  Today's Date: 05/20/2016 OT Individual Time: 1300-1325 OT Individual Time Calculation (min): 25 min    Short Term Goals: Week 1:  OT Short Term Goal 1 (Week 1): Pt will complete toilet transfer with min A OT Short Term Goal 2 (Week 1): Pt will don pants with min A OT Short Term Goal 3 (Week 1): Pt will complete sit<>stand with Min A in preparation for ADL tasks  Skilled Therapeutic Interventions/Progress Updates:    Pt seen for OT session focusing on education and functional transfers. Pt sitting up in w/c upon arrival, agreeable to tx session.  Education and demonstration provided regarding shower DME and plans for showering task tomorrow pending MD approval. Educated regarding pros and cons of shower chair vs tub transfer bench for home use as pt has access to both shower set-ups at home. Pt eager to shower tomorrow during OT session. He denied wanting to practice toilet transfer this session, desiring instead to return to supine to rest prior to next tx session. Completed stand pivot to EOB with assist to manage L LE in dependent position as pt declining donning CAM boot for transfer. Pt required assist with w/c part management prior to transfer and VCs for safety awareness during transfer. Pt left in supine at end of session, LE supported on pillows for comfort and pain management. RN made aware of pt's desire for pain meds prior to next tx session, all needs in reach.   Therapy Documentation Precautions:  Precautions Precautions: Fall Required Braces or Orthoses: Other Brace/Splint Other Brace/Splint: Donned cam walker on LLE Restrictions Weight Bearing Restrictions: Yes RLE Weight Bearing: Weight bearing as tolerated LLE Weight Bearing: Weight bearing as tolerated ADL: ADL ADL Comments: Please see functional navigator  See Function Navigator for Current Functional  Status.   Therapy/Group: Individual Therapy  Lewis, Angeleigh Chiasson C 05/20/2016, 1:26 PM

## 2016-05-20 NOTE — Progress Notes (Signed)
Occupational Therapy Session Note  Patient Details  Name: Eric MaduraMark Schmitt MRN: [id number] Date of Birth: 06/28/1960  Today's Date: 05/20/2016 OT Individual Time: 4098-11910953-1030 OT Individual Time Calculation (min): 37 min    Short Term Goals: Week 1:  OT Short Term Goal 1 (Week 1): Pt will complete toilet transfer with min A OT Short Term Goal 2 (Week 1): Pt will don pants with min A OT Short Term Goal 3 (Week 1): Pt will complete sit<>stand with Min A in preparation for ADL tasks  Skilled Therapeutic Interventions/Progress Updates:    Pt seen for OT session focusing on education and comfort with wrapping of L LE and ADL re-training. Pt sitting up in w/c upon arrival, voiced pain in L LE due to increased edema, denied alerting nurse but desiring ACE wrap be re-wrapped. Educated regarding figure eight technique with wrapping for edema management and circulation and ACE wrap re-wrapped.  He completed grooming/ UB bathing tasks at sink and assist provided to wash hair. Assist to wash L LE due to inability to functionally reach to foot. He completed stand/ squat pivot transfer to recliner, positioned with LEs elevated and positioned with pillows for comfort and edema management.  Pt left in recliner at end of session, all needs in reach.   Therapy Documentation Precautions:  Precautions Precautions: Fall Required Braces or Orthoses: Other Brace/Splint Other Brace/Splint: Donned cam walker on LLE Restrictions Weight Bearing Restrictions: No RLE Weight Bearing: Weight bearing as tolerated LLE Weight Bearing: Touchdown weight bearing ADL: ADL ADL Comments: Please see functional navigator  See Function Navigator for Current Functional Status.   Therapy/Group: Individual Therapy  Lewis, Izaak Sahr C 05/20/2016, 7:13 AM

## 2016-05-21 ENCOUNTER — Inpatient Hospital Stay (HOSPITAL_COMMUNITY): Payer: BC Managed Care – PPO | Admitting: Physical Therapy

## 2016-05-21 ENCOUNTER — Inpatient Hospital Stay (HOSPITAL_COMMUNITY): Payer: BC Managed Care – PPO | Admitting: Occupational Therapy

## 2016-05-21 ENCOUNTER — Other Ambulatory Visit: Payer: Self-pay | Admitting: Physical Medicine and Rehabilitation

## 2016-05-21 MED ORDER — SENNOSIDES-DOCUSATE SODIUM 8.6-50 MG PO TABS
2.0000 | ORAL_TABLET | Freq: Every day | ORAL | Status: DC
  Administered 2016-05-21: 2 via ORAL
  Filled 2016-05-21: qty 2

## 2016-05-21 NOTE — Progress Notes (Addendum)
Physical Therapy Session Note  Patient Details  Name: Eric MaduraMark Pinedo MRN: [id number] Date of Birth: 02/28/1960  Today's Date: 05/21/2016 PT Concurrent Time:  [id number] PT Concurrent Time Calculation (min): 48 min  Short Term Goals: Week 1:  PT Short Term Goal 1 (Week 1): Pt will perform bed mobility with min assist  PT Short Term Goal 2 (Week 1): Pt will transfer with min assist and LRAD PT Short Term Goal 3 (Week 1): Pt will maintain standing position without significant change in BP x 5 minutes  PT Short Term Goal 4 (Week 1): Pt will ambulate 4315ft with mod assist and LRAD  PT Short Term Goal 5 (Week 1): Pt will propell WC x 1850ft and min assist from PT.   Skilled Therapeutic Interventions/Progress Updates:     Tx 2: Pt received in recliner with LE elevated. Pt used leg lifter throughout session to assist in managing L LE during set-up for transfers. Pt performed stand pivot transfer from recliner to w/c using RW with min A for L LE placement. Pt required verbal cuing to stand up completely before reaching for his RW. Pt propelled w/c I from pt's room to ortho gym to perform car transfer. Pt provided with verbal instruction and demonstration for car transfer from w/c into vehicle at SUV-height, as per best guess of pt, using RW. Pt able to recall and apply verbal cues provided from previous sit>stand to this transfer. Pt required verbal cuing for sequencing and min A for L LE handling and placement throughout transfer. Pt's L LE sore this afternoon and pt unable to tolerate as much weight bearing on this extremity during transfers compared to morning session. Pt min A for management of L LE for sit>supine on treatment table. Pt performed 2x10 heel slides with pillow case under L heel with focus on improving L knee flexion. Pt returned to sitting in recliner with LEs elevated at the end of session with all needs in reach.   Therapy Documentation Precautions:  Precautions Precautions:  Fall Required Braces or Orthoses: Other Brace/Splint Other Brace/Splint: Donned cam walker on LLE Restrictions Weight Bearing Restrictions: Yes RLE Weight Bearing: Weight bearing as tolerated LLE Weight Bearing: Weight bearing as tolerated   See Function Navigator for Current Functional Status.   Therapy/Group: Concurrent  Dorothea GlassmanCaroline Ane Conerly 05/21/2016, 5:13 PM

## 2016-05-21 NOTE — Progress Notes (Signed)
Occupational Therapy Session Note  Patient Details  Name: Eric Schmitt MRN: [id number] Date of Birth: 08/28/1960  Today's Date: 05/21/2016 OT Individual Time: 1100-1200 OT Individual Time Calculation (min): 60 min    Short Term Goals: Week 1:  OT Short Term Goal 1 (Week 1): Pt will complete toilet transfer with min A OT Short Term Goal 2 (Week 1): Pt will don pants with min A OT Short Term Goal 3 (Week 1): Pt will complete sit<>stand with Min A in preparation for ADL tasks  Skilled Therapeutic Interventions/Progress Updates:    Pt seen for OT ADL bathing/dressing session. New MD orders with pt now cleared to shower. Pt in w/c upon arrival, agreeable to tx session and ready for shower. He ambulated ~3 ft into shower with therapist assisting with keeping L LE in dependent position during transfer as pt declining to don CAM boot for transfer. He bathed seated on tub transfer bench with L LE supported on trash can for comfort. Lateral leans completed for buttock hygiene. He exited shower via stand pivot with use of grab bars,  L LE remaining on trash can. Discussed home bathroom set-up and requested pt have family take pictures of various bathrooms in order to simulate home environment for safest and most comfortable transfer and bathing method/ postitioning. He dressed seated in recliner, able to push up/ lateral leans to position pants over hips. Pt remained seated in recliner at end of session, all needs in reach. Some drainage noted from L surgical site, RN made aware.    Therapy Documentation Precautions:  Precautions Precautions: Fall Required Braces or Orthoses: Other Brace/Splint Other Brace/Splint: Donned cam walker on LLE Restrictions Weight Bearing Restrictions: Yes RLE Weight Bearing: Weight bearing as tolerated LLE Weight Bearing: Weight bearing as tolerated Pain:   Voiced some complaints of pain during transfers/ mobility with L LE movement, pt pre-medicated prior to tx  session.  ADL: ADL ADL Comments: Please see functional navigator  See Function Navigator for Current Functional Status.   Therapy/Group: Individual Therapy  Lewis, Bush Murdoch C 05/21/2016, 7:09 AM

## 2016-05-21 NOTE — Progress Notes (Signed)
Occupational Therapy Session Note  Patient Details  Name: Eric Schmitt MRN: [id number] Date of Birth: 10/15/1960  Today's Date: 05/21/2016 OT Individual Time: 1300-1330 OT Individual Time Calculation (min): 30 min    Short Term Goals: Week 1:  OT Short Term Goal 1 (Week 1): Pt will complete toilet transfer with min A OT Short Term Goal 2 (Week 1): Pt will don pants with min A OT Short Term Goal 3 (Week 1): Pt will complete sit<>stand with Min A in preparation for ADL tasks  Skilled Therapeutic Interventions/Progress Updates:    Pt received in recliner stating he felt better as his blood pressure had improved but his LLE was very uncomfortable.  Pt wanted to get out of recliner to brush teeth and change shirts.  He worked on self ROM of LLE using leg lifter and then needed A to don Cam boot.  This took considerable time as the top of pt's foot was sore and it took awhile to find a good adjustment.  Pt stood up with RW and pt was encouraged to stand at sink but he was in too much pain. He completed self care from wc level. LLE elevated on leg rest.  Pt worked on UE strength with self propelling chair.  At end of session, pt was with friend and he was going to continue to work on w/c mobility in the hallways.    Therapy Documentation Precautions:  Precautions Precautions: Fall Required Braces or Orthoses: Other Brace/Splint Other Brace/Splint: Donned cam walker on LLE Restrictions Weight Bearing Restrictions: Yes RLE Weight Bearing: Weight bearing as tolerated LLE Weight Bearing: Weight bearing as tolerated   Pain: pt states pain is increased, but he had to wait for his 1330 med time, pt asked RN for meds at end of session   ADL: ADL ADL Comments: Please see functional navigator     See Function Navigator for Current Functional Status.   Therapy/Group: Individual Therapy  Armeda Plumb 05/21/2016, 12:57 PM
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
## 2016-05-21 NOTE — Progress Notes (Signed)
Physical Therapy Session Note  Patient Details  Name: Eric Schmitt MRN: [id number] Date of Birth: 03/04/1960  Today's Date: 05/21/2016 PT Individual Time: 0900-1000 PT Individual Time Calculation (min): 60 min   Short Term Goals: Week 1:  PT Short Term Goal 1 (Week 1): Pt will perform bed mobility with min assist  PT Short Term Goal 2 (Week 1): Pt will transfer with min assist and LRAD PT Short Term Goal 3 (Week 1): Pt will maintain standing position without significant change in BP x 5 minutes  PT Short Term Goal 4 (Week 1): Pt will ambulate 1415ft with mod assist and LRAD  PT Short Term Goal 5 (Week 1): Pt will propell WC x 14050ft and min assist from PT.   Skilled Therapeutic Interventions/Progress Updates: Pt received supine in bed, c/o pain as below and agreeable to treatment. Supine>sit with minA for LLE management, and HOB elevated. Gait x15' and x56' with RW and min guard. Pt initially assisting with LLE progression by reaching down with opposite UE, discouraged pt from performing like this, and pt then able to demonstrate LLE progression without assist. Minimal LLE weight bearing noted in stance d/t pain. W/c propulsion x150' with BUE for strengthening and endurance. Stand pivot transfer min guard to/from nustep with RW and min guard; requires assist for w/c setup and parts management. Nustep performed x9 min total for LE ROM; gradual increase in LLE knee flexion ROM and tolerance with repetition. Stand pivot transfer to return to w/c with RW and S. Returned to room w/c propulsion with S. Remained seated in w/c at end of session, all needs in reach.     Therapy Documentation Precautions:  Precautions Precautions: Fall Required Braces or Orthoses: Other Brace/Splint Other Brace/Splint: Donned cam walker on LLE Restrictions Weight Bearing Restrictions: Yes RLE Weight Bearing: Weight bearing as tolerated LLE Weight Bearing: Weight bearing as tolerated Pain:  Pain 7/10 LLE  knee/ankle Pre-medicated   See Function Navigator for Current Functional Status.   Therapy/Group: Individual Therapy  Vista Lawmanlizabeth J Tygielski 05/21/2016, 9:52 AM

## 2016-05-21 NOTE — Progress Notes (Signed)
Patient off the floor on hall pass. Off the floor since 1751 pm per hall pass sign out protocol.

## 2016-05-22 ENCOUNTER — Inpatient Hospital Stay (HOSPITAL_COMMUNITY): Payer: BC Managed Care – PPO | Admitting: Physical Therapy

## 2016-05-22 ENCOUNTER — Inpatient Hospital Stay (HOSPITAL_COMMUNITY): Payer: BC Managed Care – PPO | Admitting: Occupational Therapy

## 2016-05-22 MED ORDER — FERROUS SULFATE 325 (65 FE) MG PO TABS
325.0000 mg | ORAL_TABLET | Freq: Every day | ORAL | Status: DC
  Administered 2016-05-23 – 2016-05-29 (×7): 325 mg via ORAL
  Filled 2016-05-22 (×7): qty 1

## 2016-05-22 NOTE — Progress Notes (Signed)
Occupational Therapy Weekly Progress Note  Patient Details  Name: Eric Schmitt MRN: [id number] Date of Birth: 06-23-1960  Beginning of progress report period: May 16, 2016 End of progress report period: May 22, 2016  Today's Date: 05/22/2016 OT Individual Time: 0900-1000 OT Individual Time Calculation (min): 60 min    Patient has met 3 of 3 short term goals.  Pt making excellent progress towards OT goals. He cont to be most limited by pain/ discomfort in R LE and therefore requiring extremity to be in dependent position for pain management. Pt with much most independence and safety if pre-medicated prior to tx session and able to bear weight on L LE. Pt will cont to benefit from OT services to increases independence and safety with ADLs/IADLs  Patient continues to demonstrate the following deficits: muscle weakness, decreased cardiorespiratoy endurance and decreased standing balance, decreased postural control, decreased balance strategies and difficulty maintaining precautions and therefore will continue to benefit from skilled OT intervention to enhance overall performance with BADL, iADL and Reduce care partner burden.  Patient progressing toward long term goals..  Plan of care revisions: Downgraded tub/shower transfer to supervision/ set-up as pt requires assist managing all DME and AE. Marland Kitchen  OT Short Term Goals Week 1:  OT Short Term Goal 1 (Week 1): Pt will complete toilet transfer with min A OT Short Term Goal 1 - Progress (Week 1): Met OT Short Term Goal 2 (Week 1): Pt will don pants with min A OT Short Term Goal 2 - Progress (Week 1): Met OT Short Term Goal 3 (Week 1): Pt will complete sit<>stand with Min A in preparation for ADL tasks OT Short Term Goal 3 - Progress (Week 1): Met Week 2:  OT Short Term Goal 1 (Week 2): STG=LTG due to LOS  Skilled Therapeutic Interventions/Progress Updates:    Pt seen for OT session focusing on functional transfers. Pt in supine upon arrival,  agreeable to tx session. Donned new Allevian dressing to L leg wound per pt request and ACE wrap donned for edema management. Pt required increased time to come to EOB. Performed supine level LE stretches utilizing leg lifter prior to transferring. Throughout session, completed stand step pivot transfers using RW with supervision.  In ADL apartment, completed simulated tub/shower transfer utilizing tub transfer bench. Completed with supervision following demonstration for technique. Pt to attempt in next OT session a shower stall transfer to determine which method/ DME needed for use at home. Pt returned to therapy gym at end of session, left with hand off to PT.   Therapy Documentation Precautions:  Precautions Precautions: Fall Required Braces or Orthoses: Other Brace/Splint Other Brace/Splint: Donned cam walker on LLE Restrictions Weight Bearing Restrictions: Yes RLE Weight Bearing: Weight bearing as tolerated LLE Weight Bearing: Weight bearing as tolerated ADL: ADL ADL Comments: Please see functional navigator  See Function Navigator for Current Functional Status.   Therapy/Group: Individual Therapy  Lewis, Koya Hunger C 05/22/2016, 6:38 AM

## 2016-05-22 NOTE — Plan of Care (Signed)
Problem: RH Tub/Shower Transfers Goal: LTG Patient will perform tub/shower transfers w/assist (OT) LTG: Patient will perform tub/shower transfers with assist, with/without cues using equipment (OT)  Goal modified as pt requires assist with DME/ AE management during transfer. AL 5/18

## 2016-05-22 NOTE — Progress Notes (Cosign Needed Addendum)
Physical Therapy Weekly Progress Note  Patient Details  Name: Eric Schmitt MRN: [id number] Date of Birth: April 25, 1960  Beginning of progress report period: May 16, 2016 End of progress report period: May 22, 2016  Today's Date: 05/22/2016 PT Individual Time: 1000-1103 PT Individual Time Calculation (min): 63 min   Patient has met 5 of 5 short term goals.  Pt has met all week 1 STGs. Pt demonstrates ability to perform bed mobility and transfers with min A for management of L LE. Pt has ambulated 2f with RW and min guard with verbal cues for sequencing. Pt propels WC >150 ft with S. Early on, [facility] has [facility] so that pt has been asymptomatic in standing and walking. At times pt is impulsive and becomes distracted easily, requiring cues for safety and wheelchair management throughout sessions. Pt's wife has not been present for education during therapy sessions and will require practice with transfers next week prior to pt discharge.   Patient continues to demonstrate the following deficits muscle weakness and muscle joint tightness and therefore will continue to benefit from skilled PT intervention to increase functional independence with mobility.  Patient progressing toward long term goals..  Plan of care revisions: LTGs revised to Mod I with transfers and bed mobility.  PT Short Term Goals Week 1:  PT Short Term Goal 1 (Week 1): Pt will perform bed mobility with min assist  PT Short Term Goal 1 - Progress (Week 1): Met PT Short Term Goal 2 (Week 1): Pt will transfer with min assist and LRAD PT Short Term Goal 2 - Progress (Week 1): Met PT Short Term Goal 3 (Week 1): Pt will maintain standing position without significant change in BP x 5 minutes  PT Short Term Goal 3 - Progress (Week 1): Met PT Short Term Goal 4 (Week 1): Pt will ambulate 156fwith mod assist and LRAD  PT Short Term Goal 4 - Progress (Week 1): Met PT Short Term Goal 5 (Week 1): Pt will propell WC x  15030fnd min assist from PT.  PT Short Term Goal 5 - Progress (Week 1): Met  Week 2: = LTGs due to pt estimated LOS.    Skilled Therapeutic Interventions/Progress Updates:   Pt received in rehab gym sitting in w/c with L LE elevated. Pt expressed need to use the restroom and propelled w/c from gym to restroom in the apartment. Pt required min A for set-up and L LE management to sit>stand in order to use the toilet. Once standing, pt used R rail to stabilize himself in order to use the toilet with S. Pt ambulated 93 ft using RW and min guard without any rest breaks. Pt required verbal cues to keep walker further in front of him to improve stability during gait. Pt tolerated more weight-bearing on L LE today compared to yesterday. Pt educated on technique for ascending and descending stairs using B railings. Pt negotiated 3" stairs x6 with B rails and min guard. Pt then ambulated using RW to the 6" stairs, where he negotiated 3 stairs with min guard and verbal cues for sequencing and foot placement on the stairs for improved safety. Pt min guard for stand pivot transfer from w/c to Nustep and requires min A for L LE management. Nustep for 6 min to improve knee ROM. Pt passive L knee flexion 50deg. Pt educated on goal for using the Nustep being to improve ROM rather than cardiovascular endurance. Pt propelled w/c back to  his room and was left sitting in w/c with L LE elevated and all needs in reach.   Therapy Documentation Precautions:  Precautions Precautions: Fall Required Braces or Orthoses: Other Brace/Splint Other Brace/Splint: Donned cam walker on LLE Restrictions Weight Bearing Restrictions: Yes RLE Weight Bearing: Weight bearing as tolerated LLE Weight Bearing: Weight bearing as tolerated   See Function Navigator for Current Functional Status.  Therapy/Group: Individual Therapy  Alysia Penna 05/22/2016, 4:34 PM
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
## 2016-05-22 NOTE — Progress Notes (Signed)
Physical Therapy Note  Patient Details  Name: Eric Schmitt MRN: [id number] Date of Birth: 12/08/1960 Today's Date: 05/22/2016    Time: 1445-1518 33 minutes  1:1 Pt with no c/o pain at rest, pt with pain with AAROM and PROM, eases with rest.  Session focused on increasing Lt knee ROM through PROM, scar massage and gentle patella mobility.  Pt able to tolerate well and improve from 30-50 degrees of knee flexion during session.  Pt left on nu step to continue ROM exercises with different PT observing.    Olive Zmuda 05/22/2016, 3:20 PM

## 2016-05-22 NOTE — Progress Notes (Signed)
Occupational Therapy Note  Patient Details  Name: Eric MaduraMark Schmitt MRN: [id number] Date of Birth: 12/13/1960  Today's Date: 05/22/2016 OT Individual Time: 8295-62131405-1445 OT Individual Time Calculation (min): 40 min   Pain: 6/10 L leg - premedicated  Pt seen this session to problem solve his shower vs. Tub bench set up at home. Pt declined shower this session as he had PT right afterwards.  Pt propelled self to ADL apt. Discussed shower set up and set up blue block to simulate his home set up. Pt was certain that his home shower was much smaller and that he would not fit in there sitting down on chair. Discussed logistics of CAM boot with shower. He would have to keep it on to safely step in and out of shower. With that in consideration, pt felt the tub bench would be a better option as he would have more space and he could more easily don/doff CAM boot from edge of bench.  Pt wanted to discuss his home situation and that due to some personal stress at home he will stay with his adult daughter.  Discussed plans for discharge.   Pt wanted relief for L foot and wanted to remove boot. He continues to need A to don/doff the Cam boot and uses the leg lifter to move his leg. Pt returned to his room to meet his PT.    Eric Schmitt 05/22/2016, 3:09 PM

## 2016-05-22 NOTE — Progress Notes (Signed)
Physical Therapy Session Note  Patient Details  Name: Eric Schmitt MRN: [id number] Date of Birth: 12/19/1960  Today's Date: 05/22/2016 PT Concurrent Time: 1530-1545 PT Concurrent Time Calculation (min): 15 min  Short Term Goals: Week 2:    = LTG due to estimated LOS  Skilled Therapeutic Interventions/Progress Updates: Pt received seated on nustep with handoff from previous PT. Performed nustep x10 min total with BUE/BLE for strengthening, aerobic endurance and LLE knee flexion ROM. Upon completion of nustep, required cues to prevent transfer to w/c before it had been setup correctly or locked. Educated pt in slowing down speed of activities, double checking all safety aspects of transfer prior to completing especially when transferring to unstable w/c. Performed squat pivot transfer to w/c with close S and leg lifter. W/c propulsion back to room modI at end of session.      Therapy Documentation Precautions:  Precautions Precautions: Fall Required Braces or Orthoses: Other Brace/Splint Other Brace/Splint: Donned cam walker on LLE Restrictions Weight Bearing Restrictions: Yes RLE Weight Bearing: Weight bearing as tolerated LLE Weight Bearing: Weight bearing as tolerated   See Function Navigator for Current Functional Status.   Therapy/Group: Individual Therapy  Vista Lawmanlizabeth J Tygielski 05/22/2016, 3:52 PM

## 2016-05-22 NOTE — Plan of Care (Addendum)
Problem: RH Bed Mobility Goal: LTG Patient will perform bed mobility with assist (PT) LTG: Patient will perform bed mobility with assistance, with/without cues (PT).  Upgraded due to progress  Problem: RH Bed to Chair Transfers Goal: LTG Patient will perform bed/chair transfers w/assist (PT) LTG: Patient will perform bed/chair transfers with assistance, with/without cues (PT).  Upgraded due to progress

## 2016-05-23 ENCOUNTER — Inpatient Hospital Stay (HOSPITAL_COMMUNITY): Payer: BC Managed Care – PPO | Admitting: Occupational Therapy
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
## 2016-05-23 NOTE — Plan of Care (Signed)
Problem: RH PAIN MANAGEMENT Goal: RH STG PAIN MANAGED AT OR BELOW PT'S PAIN GOAL <2  Outcome: Not Progressing Consistently rating pain > 5

## 2016-05-23 NOTE — Progress Notes (Signed)
Restless night. C/O "tightness" to left knee. 2 PRN vicodin given at 2317. Requested med for sleep. PRN trazodone 25mg  given at 2325, requested other 25mg  and given at 0006. At 0115 PRN ultram & robaxin given. At [phone], requested 1 PRN vicodin. Eric Schmitt, Eric Schmitt

## 2016-05-23 NOTE — Progress Notes (Signed)
Occupational Therapy Session Note  Patient Details  Name: Eric Schmitt MRN: [id number] Date of Birth: Nov 21, 1960  Today's Date: 05/23/2016 OT Individual Time: 7981-0254 OT Individual Time Calculation (min): 83 min    Short Term Goals: Week 1:  OT Short Term Goal 1 (Week 1): Pt will complete toilet transfer with min A OT Short Term Goal 1 - Progress (Week 1): Met OT Short Term Goal 2 (Week 1): Pt will don pants with min A OT Short Term Goal 2 - Progress (Week 1): Met OT Short Term Goal 3 (Week 1): Pt will complete sit<>stand with Min A in preparation for ADL tasks OT Short Term Goal 3 - Progress (Week 1): Met  Skilled Therapeutic Interventions/Progress Updates: Upon approach for OT this patient stated he wanted to shower. He talked constantly as if nervous or uncomfortable, required extra time to pick out his clothing, extra time to transfer bed to w/c (close S), and extra time to complete shower.  Overall he required moderate assistance to position and move his left lower exgtremity.  He tended to guard it as he stated, he has much pain if he moves it in the wrong direction or if it touches the floor.   He asked to prop the left leg/foot on a small waste can for comfort during shower.  He took extra time and was left in his recliner chair finishing dressing with his nurse tech upon this clinician's room exit.     Therapy Documentation Precautions:  Precautions Precautions: Fall Required Braces or Orthoses: Other Brace/Splint Other Brace/Splint: Donned cam walker on LLE Restrictions Weight Bearing Restrictions: Yes RLE Weight Bearing: Weight bearing as tolerated LLE Weight Bearing: Weight bearing as tolerated   Pain "none at this minute"  See Function Navigator for Current Functional Status.   Therapy/Group: Individual Therapy  Alfredia Ferguson [facility] 05/23/2016, 5:41 PM

## 2016-05-24 ENCOUNTER — Inpatient Hospital Stay (HOSPITAL_COMMUNITY): Payer: BC Managed Care – PPO

## 2016-05-24 MED ORDER — IBUPROFEN 400 MG PO TABS
400.0000 mg | ORAL_TABLET | Freq: Four times a day (QID) | ORAL | Status: DC | PRN
Start: 2016-05-24 — End: 2016-05-29
  Administered 2016-05-24 – 2016-05-29 (×4): 400 mg via ORAL
  Filled 2016-05-24 (×6): qty 1

## 2016-05-24 NOTE — Progress Notes (Signed)
Occupational Therapy Session Note  Patient Details  Name: Eric MaduraMark Schmitt MRN: [id number] Date of Birth: 06/07/1960  Today's Date: 05/24/2016 OT Individual Time: 0800-0900 OT Individual Time Calculation (min): 60 min    Short Term Goals: Week 2:  OT Short Term Goal 1 (Week 2): STG=LTG due to LOS  Skilled Therapeutic Interventions/Progress Updates:    1:1. RN administering pain medication as OT enters session. Focus of session on ADL retraining, tub transfers, and functional mobility. Pt declines opportunity to shower this date, however requests sink bath for UB/per area. Pt completes lateral scoot transfer to w/c with supervision and VC for safety awareness. Pt bathes UB/peri area seated in w/c with supervision. Pt stand step transfer with RW<>toilet with supervision to toilet and MOD A for lifting from low surface toilet>w/c. Pt voids bowel with supervision completing clothing management/hygiene with lateral leans.  Pt requires Vc for RW management throughout transfer. Pt grooms seated in w/c at sink. Pt complete lateral scoot transfer w/c<>TTB using leg lifter to manage LLE with wife present to learn sequencing. Discussed TTB v. shower chair option for upcoming d/c and all aggre on TTB being best option. Pt completes stand pivot transfer to/from standard bed with supervision and VC for RW management and use of LE lifter for LLE. Exited session with pt seated in w/c with call light in reach and wife present in room.   Therapy Documentation Precautions:  Precautions Precautions: Fall Required Braces or Orthoses: Other Brace/Splint Other Brace/Splint: Donned cam walker on LLE Restrictions Weight Bearing Restrictions: Yes RLE Weight Bearing: Weight bearing as tolerated LLE Weight Bearing: Weight bearing as tolerated General:   ADL ADL Comments: Please see functional navigator   See Function Navigator for Current Functional Status.   Therapy/Group: Individual Therapy  Shon HaleStephanie M  Orvie Caradine 05/24/2016, 8:27 AM

## 2016-05-24 NOTE — Progress Notes (Signed)
Rested better than previous night, but continues to complain of tightness and numbness to lateral, left knee. Used Kpad intermittently. 2 vicodin given at 2155 and 0336. Observed patient's wife using TENS unit to left knee. Eric Schmitt, Eric Schmitt
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
## 2016-05-25 ENCOUNTER — Inpatient Hospital Stay (HOSPITAL_COMMUNITY): Payer: BC Managed Care – PPO | Admitting: Physical Therapy

## 2016-05-25 ENCOUNTER — Inpatient Hospital Stay (HOSPITAL_COMMUNITY): Payer: BC Managed Care – PPO

## 2016-05-25 LAB — CBC
HEMATOCRIT: 28.3 % — AB (ref 39.0–52.0)
Hemoglobin: 9.3 g/dL — ABNORMAL LOW (ref 13.0–17.0)
MCH: 31 pg (ref 26.0–34.0)
MCHC: 32.9 g/dL (ref 30.0–36.0)
MCV: 94.3 fL (ref 78.0–100.0)
Platelets: 483 10*3/uL — ABNORMAL HIGH (ref 150–400)
RBC: 3 MIL/uL — AB (ref 4.22–5.81)
RDW: 13.6 % (ref 11.5–15.5)
WBC: 8 10*3/uL (ref 4.0–10.5)

## 2016-05-25 NOTE — Plan of Care (Signed)
Problem: RH Stairs Goal: LTG Patient will ambulate up and down stairs w/assist (PT) LTG: Patient will ambulate up and down # of stairs with assistance (PT)  changed d/t rails to be installed at home prior to d/c

## 2016-05-25 NOTE — Progress Notes (Signed)
Physical Therapy Session Note  Patient Details  Name: Eric Schmitt MRN: [id number] Date of Birth: 02-23-1960  Today's Date: 05/25/2016 PT Individual Time: 0900-1000 PT Individual Time Calculation (min): 60 min  and Today's Date: 05/25/2016 PT Concurrent Time: 1000-1010 PT Concurrent Time Calculation (min): 10 min  Short Term Goals: Week 1:  PT Short Term Goal 1 (Week 1): Pt will perform bed mobility with min assist  PT Short Term Goal 1 - Progress (Week 1): Met PT Short Term Goal 2 (Week 1): Pt will transfer with min assist and LRAD PT Short Term Goal 2 - Progress (Week 1): Met PT Short Term Goal 3 (Week 1): Pt will maintain standing position without significant change in BP x 5 minutes  PT Short Term Goal 3 - Progress (Week 1): Met PT Short Term Goal 4 (Week 1): Pt will ambulate 68f with mod assist and LRAD  PT Short Term Goal 4 - Progress (Week 1): Met PT Short Term Goal 5 (Week 1): Pt will propell WC x 1511fand min assist from PT.  PT Short Term Goal 5 - Progress (Week 1): Met Week 2:  PT Short Term Goal 1 (Week 2): =LTG due to estimated LOS  Skilled Therapeutic Interventions/Progress Updates:   Pt received in w/c with L LE elevated. SPT assisted pt in donning of L sock and boot. Pt propelled w/c from pt's room to day room. Pt able to recall proper technique for safe sit>>from w/c using RW with S and no verbal cues from SPT. Pt ambulated 3012from w/c to sit on low couch with S. Pt reported "almost no pain" initially during ambulation, but his pain increased as he ambulated more during session. Pt demonstrated ability to perform sit>>stand from lower and softer surface with S. Pt ambulated approximately 8f42f, safely maneuvering around obstacles, before having to sit down today. Pt demonstrated improved stability and safety awareness during ambulation and transfers today, requiring very minimal cues for positioning throughout session. Pt able to independently manage L LE during 75% of  session today without use of leg lift. Nustep x 10 min for improved L knee ROM. Pt's L knee flexion PROM measured to be 35 deg today, limited by stiffness and pain. Pt negotiated 3 6" stairs with B rails and min guard, requiring cues for sequencing. Once pt finished descending the stairs, pt's face became pale and he complained of feeling hot and mildly lightheaded. Pt rested in a chair for 3 min before symptoms subsided and pt felt well enough to stand pivot transfer from chair to his w/c. Pt left propelling w/c back to his room with all needs met.   Therapy Documentation Precautions:  Precautions Precautions: Fall Required Braces or Orthoses: Other Brace/Splint Other Brace/Splint: Donned cam walker on LLE Restrictions Weight Bearing Restrictions: Yes RLE Weight Bearing: Weight bearing as tolerated LLE Weight Bearing: Weight bearing as tolerated   See Function Navigator for Current Functional Status.   Therapy/Group: Individual Therapy and Concurrent  CaroAlysia Penna1/2018, 12:06 PM
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
## 2016-05-25 NOTE — Progress Notes (Signed)
Occupational Therapy Session Note  Patient Details  Name: Eric Schmitt MRN: [id number] Date of Birth: 02/27/1960  Today's Date: 05/25/2016 OT Individual Time: 1100-1156 OT Individual Time Calculation (min): 56 min    Short Term Goals: Week 2:  OT Short Term Goal 1 (Week 2): STG=LTG due to LOS  Skilled Therapeutic Interventions/Progress Updates:    Pt asleep in bed upon arrival but easily aroused.  Focus on bed mobility, sit<>stand, standing balance, and ongoing discharge planning. Discussed equipment needs.  Pt states he does not need a BSC but will need a tub transfer bench. Pt requires min A for sit<>stand from lower surfaces.  Pt requires multiple rest breaks secondary to increased pain with movement.  Pt stated he was sore/tired from earlier PT session.  Pt returned to bed and remained in bed with all needs within reach.   Therapy Documentation Precautions:  Precautions Precautions: Fall Required Braces or Orthoses: Other Brace/Splint Other Brace/Splint: Donned cam walker on LLE Restrictions Weight Bearing Restrictions: Yes RLE Weight Bearing: Weight bearing as tolerated LLE Weight Bearing: Weight bearing as tolerated  Pain:  Pt c/o 3/10 pain in LLE; RN aware and repositioned  See Function Navigator for Current Functional Status.   Therapy/Group: Individual Therapy  Rich BraveLanier, Keondria Siever Chappell 05/25/2016, 11:56 AM

## 2016-05-25 NOTE — Progress Notes (Signed)
Occupational Therapy Note  Patient Details  Name: Eric Schmitt MRN: [id number] Date of Birth: 09/02/1960  Today's Date: 05/25/2016 OT Individual Time: 1345-1430 OT Individual Time Calculation (min): 45 min   Pt denied pain Individual therapy  Pt engaged in functional amb with RW to access bathroom with simulated home setup.  Pt amb with RW into bathroom to use toilet while standing to void.  Pt practed walk-in shower transfers.  Pt thought he would be able to stand to take shower because of small shower stall.  Pt noted with LOB X 1 while attempting.  Pt verbalized understanding of recommendation to sit for bathing.  Pt verbalized understanding of recommendation for use of TTB with tub at home.  Pt practiced bed mobility at supervision level.  Pt returned to room and transferred to bed at supervision level.  Pt remained in bed with all needs within reach.    Eric Schmitt, Eric Schmitt Bl[facility] LLCChappell 05/25/2016, 2:50 PM

## 2016-05-25 NOTE — Progress Notes (Signed)
Physical Therapy Session Note  Patient Details  Name: Eric Schmitt MRN: [id number] Date of Birth: 02/03/1960  Today's Date: 05/25/2016 PT Individual Time: 1545-1610 PT Individual Time Calculation (min): 25 min   Short Term Goals: Week 2:  PT Short Term Goal 1 (Week 2): =LTG due to estimated LOS  Skilled Therapeutic Interventions/Progress Updates: Pt presented in bed agreeable to therapy. PTA assist for donning boot and shoe for time management. Performed ambulatory transfer to w/c supervision. Initially propel to gym then stopped and ambulated 113f with RW. Pt initially maintaining LLE NWB encouraged to decrease pace and increase wt bearing through LLE which pt was able to do in short bouts. Pt performed UE bike 1 min increments for cardiovascular endurance. Performed both forward and backwards. Performed SL squats in parallel bars for strengthening. Returned to room via w/c propulsion mod I and returned to bed in same manner as prior. Pt remained in bed at end of session with needs met.      Therapy Documentation Precautions:  Precautions Precautions: Fall Required Braces or Orthoses: Other Brace/Splint Other Brace/Splint: Donned cam walker on LLE Restrictions Weight Bearing Restrictions: Yes RLE Weight Bearing: Weight bearing as tolerated LLE Weight Bearing: Weight bearing as tolerated General:   Vital Signs: Therapy Vitals Temp: 98.6 F (37 C) Temp Source: Oral Pulse Rate: 88 Resp: 18 BP: (!) 143/82 (RN notified) Patient Position (if appropriate): Lying Oxygen Therapy SpO2: 100 % O2 Device: Not Delivered     See Function Navigator for Current Functional Status.   Therapy/Group: Individual Therapy  Azaleah Usman  Mamoudou Mulvehill, PTA  05/25/2016, 4:21 PM

## 2016-05-26 ENCOUNTER — Inpatient Hospital Stay (HOSPITAL_COMMUNITY): Payer: BC Managed Care – PPO | Admitting: Physical Therapy

## 2016-05-26 ENCOUNTER — Encounter (HOSPITAL_COMMUNITY): Payer: BC Managed Care – PPO | Admitting: Psychology

## 2016-05-26 ENCOUNTER — Inpatient Hospital Stay (HOSPITAL_COMMUNITY): Payer: BC Managed Care – PPO | Admitting: Occupational Therapy

## 2016-05-26 ENCOUNTER — Inpatient Hospital Stay (HOSPITAL_COMMUNITY): Payer: BC Managed Care – PPO

## 2016-05-26 NOTE — Progress Notes (Signed)
Occupational Therapy Session Note  Patient Details  Name: Eric Schmitt MRN: [id number] Date of Birth: 06/14/1960  Today's Date: 05/26/2016 OT Individual Time: [id number] OT Individual Time Calculation (min): 45 min    Short Term Goals: Week 2:  OT Short Term Goal 1 (Week 2): STG=LTG due to LOS  Skilled Therapeutic Interventions/Progress Updates:    Pt completed donning shorts, left sock, cam boot on the left foot, and regular shoe on the right shoe during session.  He was able to complete sit to stand and functional mobility using the crutches with supervision to ambulate toward the day room.  He needed one rest break in sitting at the half way point.  Therapist helped transition him the rest of the way to the couch in the day room via wheelchair.  Completed transfers from wheelchair to couch with supervision using the crutches.  He next ambulated from the day room back to his room with the crutches with supervision.  Once back in the room he sat in the bedside chair with family present and call button in reach.  He was able to remove his cam boot independently.    Therapy Documentation Precautions:  Precautions Precautions: Fall Required Braces or Orthoses: Other Brace/Splint Other Brace/Splint: Donned cam walker on LLE Restrictions Weight Bearing Restrictions: No RLE Weight Bearing: Weight bearing as tolerated LLE Weight Bearing: Weight bearing as tolerated  Pain: Pain Assessment Pain Assessment: Faces Faces Pain Scale: Hurts a little bit Pain Type: Acute pain Pain Location: Leg Pain Orientation: Left Pain Descriptors / Indicators: Discomfort Pain Intervention(s): Repositioned;Emotional support ADL: See Function Navigator for Current Functional Status.   Therapy/Group: Individual Therapy  Eric Schmitt OTR/L 05/26/2016, 4:06 PM

## 2016-05-26 NOTE — Plan of Care (Signed)
Problem: RH BOWEL ELIMINATION Goal: RH STG MANAGE BOWEL W/MEDICATION W/ASSISTANCE STG Manage Bowel with Medication with Min Assistance.  Outcome: Not Progressing Pt refused laxative.  Will attempt to offer laxative later in shift.
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
## 2016-05-26 NOTE — Progress Notes (Signed)
Physical Therapy Session Note  Patient Details  Name: Eric MaduraMark Wiehe MRN: [id number] Date of Birth: 05/18/1960  Today's Date: 05/26/2016 PT Individual Time: [id number] PT Individual Time Calculation (min): 32 min    Skilled Therapeutic Interventions/Progress Updates:    Pt denies pain upon therapist entering room.  Pt up in room with nursing present.  Pt dressed upper and lower body without assistance while supine in bed with HOB elevated.  Pt then transferred supine to sit with supervision after therapist donned CAM boot in supine.  Pt ambulated 100 ft with FWW and supervision while performing intermittent weight shifting over L LE which increases L LE pain to 7/10.  Pt does require verbal cues at times to maintain B UE on walker during ambulation.  Pt responds to therapist redirection to task.  Following session, pt left up in chair with call bell and phone in reach, eating breakfast.  Therapy Documentation Precautions:  Precautions Precautions: Fall Required Braces or Orthoses: Other Brace/Splint Other Brace/Splint: Donned cam walker on LLE Restrictions Weight Bearing Restrictions: Yes RLE Weight Bearing: Weight bearing as tolerated LLE Weight Bearing: Weight bearing as tolerated     See Function Navigator for Current Functional Status.   Therapy/Group: Individual Therapy  Mical Kicklighter Elveria RisingM Jasmine Mcbeth 05/26/2016, 11:09 AM

## 2016-05-26 NOTE — Progress Notes (Signed)
Results from Xray called to [name], PA. No new orders received . Continue with plan of care .  Eric Schmitt, [name]

## 2016-05-26 NOTE — Progress Notes (Addendum)
Occupational Therapy Session Note  Patient Details  Name: Eric Schmitt MRN: [id number] Date of Birth: [birth year]  Today's Date: 05/26/2016 OT Individual Time: 1000-1100 OT Individual Time Calculation (min): 60 min    Short Term Goals: Week 1:  OT Short Term Goal 1 (Week 1): Pt will complete toilet transfer with min A OT Short Term Goal 1 - Progress (Week 1): Met OT Short Term Goal 2 (Week 1): Pt will don pants with min A OT Short Term Goal 2 - Progress (Week 1): Met OT Short Term Goal 3 (Week 1): Pt will complete sit<>stand with Min A in preparation for ADL tasks OT Short Term Goal 3 - Progress (Week 1): Met  Skilled Therapeutic Interventions/Progress Updates:    Pt engaged in BADL retraining including bathing at shower level in ADL apartment and dressing at w/c level.  Pt completed all tasks at supervision level with min A for safety awareness during transfers. Pt verbalized understanding of recommendation for tub transfer bench.  Pt returned to his room and remained in w/c awaiting his next therapy.   Therapy Documentation Precautions:  Precautions Precautions: Fall Required Braces or Orthoses: Other Brace/Splint Other Brace/Splint: Donned cam walker on LLE Restrictions Weight Bearing Restrictions: Yes RLE Weight Bearing: Weight bearing as tolerated LLE Weight Bearing: Weight bearing as tolerated   Pain: Pain Assessment Pain Assessment: 0-10 Pain Score: 9  Pain Type: Surgical pain Pain Location: Leg Pain Orientation: Left Pain Descriptors / Indicators: Aching Pain Frequency: Constant Pain Onset: On-going Patients Stated Pain Goal: 4 Pain Intervention(s): premedicated and repositioned.   Other Treatments:    See Function Navigator for Current Functional Status.   Therapy/Group: Individual Therapy  Leroy Libman 05/26/2016, 12:15 PM
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
## 2016-05-26 NOTE — Progress Notes (Signed)
Physical Therapy Session Note  Patient Details  Name: Eric Schmitt MRN: [id number] Date of Birth: 03/21/1960  Today's Date: 05/26/2016 PT Individual Time: 1120-1200 PT Individual Time Calculation (min): 40 min    Therapy Documentation Precautions:  Precautions Precautions: Fall Required Braces or Orthoses: Other Brace/Splint Other Brace/Splint: Donned cam walker on LLE Restrictions Weight Bearing Restrictions: Yes RLE Weight Bearing: Weight bearing as tolerated LLE Weight Bearing: Weight bearing as tolerated General:   Vital Signs: Therapy Vitals Temp: 98.4 F (36.9 C) Temp Source: Oral Pulse Rate: 91 Resp: 16 BP: 138/84 Patient Position (if appropriate): Lying Oxygen Therapy SpO2: 100 % O2 Device: Not Delivered Pain: Pain Assessment Pain Assessment: 0-10 Pain Score: 9  Pain Type: Surgical pain Pain Location: Leg Pain Orientation: Left Pain Descriptors / Indicators: Aching Pain Frequency: Constant Pain Onset: On-going Patients Stated Pain Goal: 4 Pain Intervention(s): Medication (See eMAR) (vicodin 2 po)   Sit to and from stand transfer with bilateral crutches min a verbal cues for proper sequence and technique Ambulatory transfer with bilateral crutches min a verbal cues for proper sequence and technique  Patient ambulated 50 and 75 feet with bilateral crutches min a verbal cues for proper sequence and technique. Tactile cues for upright posture. Visual demonstration provided for proper step length and crutch management.   Stair negotiation 8 steps (3inch) with bilateral crutches min a. Patient ascends and descends with LLE in order for improved posture.   Patient returned to room at end of session and handed off directly to nurse tech and patient scheduled for x-ray.    See Function Navigator for Current Functional Status.   Therapy/Group: Individual Therapy  Eric Schmitt,Eric Schmitt 05/26/2016, 2:16 PM

## 2016-05-26 NOTE — Plan of Care (Signed)
Problem: RH PAIN MANAGEMENT Goal: RH STG PAIN MANAGED AT OR BELOW PT'S PAIN GOAL <2  Outcome: Not Progressing Pain managed at 4 on 0-10 scale at this time   Problem: Consults Goal: RH GENERAL PATIENT EDUCATION See Patient Education module for education specifics.  Outcome: Progressing Patient able to verbalize management of pain with alternatives such as elevate extremity prn , quiet environment.

## 2016-05-26 NOTE — Progress Notes (Cosign Needed)
Physical Therapy Session Note  Patient Details  Name: Eric Schmitt MRN: [id number] Date of Birth: Dec 17, 1960  Today's Date: 05/26/2016 PT Individual Time: 0900-1000 PT Individual Time Calculation (min): 60 min   Short Term Goals: Week 1:  PT Short Term Goal 1 (Week 1): Pt will perform bed mobility with min assist  PT Short Term Goal 1 - Progress (Week 1): Met PT Short Term Goal 2 (Week 1): Pt will transfer with min assist and LRAD PT Short Term Goal 2 - Progress (Week 1): Met PT Short Term Goal 3 (Week 1): Pt will maintain standing position without significant change in BP x 5 minutes  PT Short Term Goal 3 - Progress (Week 1): Met PT Short Term Goal 4 (Week 1): Pt will ambulate 82f with mod assist and LRAD  PT Short Term Goal 4 - Progress (Week 1): Met PT Short Term Goal 5 (Week 1): Pt will propell WC x 1554fand min assist from PT.  PT Short Term Goal 5 - Progress (Week 1): Met Week 2:  PT Short Term Goal 1 (Week 2): =LTG due to estimated LOS  Skilled Therapeutic Interventions/Progress Updates:   Tx 1: Pt received sitting in recliner with LEs elevated. Pt required assistance from student PT to donn boot. Pt S for stand pivot transfer from recliner to w/c using RW. Pt propelled w/c from his room to rehab gym. Pt educated on sit>>stand and ambulation using B axillary crutches. Pt S for sit>>stand from w/c using armrests and was then able to reach for crutches. Pt ambulated approximately 40 ft before sitting due to L LE pain and fatigue. Pt required occasional cuing for placement of crutches during ambulation. Pt ambulated with min guard using crutches around obstacles that required turning and stepping over items. Pt complained of feeling dizzy and fatigued after ambulating 3020fhrough obstacles, requiring a rest break in seated until symptoms resolved. After rest break, pt completed the same obstacles again without any complaints of symptoms. Pt educated on proper technique for stair ascent  and descent using crutches. Pt able to ascend 2 stairs with min A using crutches before he began complaining of light-headedness, requiring pt to step back down the stairs and return to sitting in his w/c. After resting for 2-3 minutes, pt's symptoms had resolved, and pt propelled w/c to nurses station. Pt left seated in w/c talking to PA at nurses station with all needs met.   Therapy Documentation Precautions:  Precautions Precautions: Fall Required Braces or Orthoses: Other Brace/Splint Other Brace/Splint: Donned cam walker on LLE Restrictions Weight Bearing Restrictions: Yes RLE Weight Bearing: Weight bearing as tolerated LLE Weight Bearing: Weight bearing as tolerated   See Function Navigator for Current Functional Status.   Therapy/Group: Individual Therapy  CarAlysia Penna22/2018, 10:59 AM

## 2016-05-27 ENCOUNTER — Inpatient Hospital Stay (HOSPITAL_COMMUNITY): Payer: BC Managed Care – PPO

## 2016-05-27 ENCOUNTER — Inpatient Hospital Stay (HOSPITAL_COMMUNITY): Payer: BC Managed Care – PPO | Admitting: Occupational Therapy

## 2016-05-27 ENCOUNTER — Inpatient Hospital Stay (HOSPITAL_COMMUNITY): Payer: BC Managed Care – PPO | Admitting: Physical Therapy

## 2016-05-27 LAB — CBC WITH DIFFERENTIAL/PLATELET
BASOS ABS: 0 10*3/uL (ref 0.0–0.1)
BASOS PCT: 0 %
EOS ABS: 0.2 10*3/uL (ref 0.0–0.7)
Eosinophils Relative: 2 %
HEMATOCRIT: 29.5 % — AB (ref 39.0–52.0)
HEMOGLOBIN: 9.7 g/dL — AB (ref 13.0–17.0)
Lymphocytes Relative: 13 %
Lymphs Abs: 1.1 10*3/uL (ref 0.7–4.0)
MCH: 31.4 pg (ref 26.0–34.0)
MCHC: 32.9 g/dL (ref 30.0–36.0)
MCV: 95.5 fL (ref 78.0–100.0)
Monocytes Absolute: 0.4 10*3/uL (ref 0.1–1.0)
Monocytes Relative: 5 %
NEUTROS PCT: 80 %
Neutro Abs: 7.2 10*3/uL (ref 1.7–7.7)
Platelets: 489 10*3/uL — ABNORMAL HIGH (ref 150–400)
RBC: 3.09 MIL/uL — ABNORMAL LOW (ref 4.22–5.81)
RDW: 13.7 % (ref 11.5–15.5)
WBC: 8.9 10*3/uL (ref 4.0–10.5)

## 2016-05-27 LAB — BASIC METABOLIC PANEL
ANION GAP: 8 (ref 5–15)
BUN: 16 mg/dL (ref 6–20)
CALCIUM: 8.7 mg/dL — AB (ref 8.9–10.3)
CO2: 26 mmol/L (ref 22–32)
CREATININE: 0.82 mg/dL (ref 0.61–1.24)
Chloride: 101 mmol/L (ref 101–111)
Glucose, Bld: 138 mg/dL — ABNORMAL HIGH (ref 65–99)
Potassium: 4.1 mmol/L (ref 3.5–5.1)
SODIUM: 135 mmol/L (ref 135–145)

## 2016-05-27 LAB — CK TOTAL AND CKMB (NOT AT ARMC)
CK, MB: 1.8 ng/mL (ref 0.5–5.0)
Relative Index: INVALID (ref 0.0–2.5)
Total CK: 62 U/L (ref 49–397)

## 2016-05-27 MED ORDER — SENNOSIDES-DOCUSATE SODIUM 8.6-50 MG PO TABS
1.0000 | ORAL_TABLET | Freq: Every day | ORAL | Status: DC
  Administered 2016-05-27 – 2016-05-28 (×2): 1 via ORAL
  Filled 2016-05-27 (×2): qty 1

## 2016-05-27 MED ORDER — CEPHALEXIN 250 MG PO CAPS
500.0000 mg | ORAL_CAPSULE | Freq: Three times a day (TID) | ORAL | Status: DC
  Administered 2016-05-27 – 2016-05-29 (×6): 500 mg via ORAL
  Filled 2016-05-27 (×7): qty 2

## 2016-05-27 NOTE — Progress Notes (Signed)
Occupational Therapy Session Note  Patient Details  Name: Eric Schmitt MRN: [id number] Date of Birth: 12/20/1960  Today's Date: 05/27/2016 OT Individual Time: 1300-1330 OT Individual Time Calculation (min): 30 min    Short Term Goals: Week 2:  OT Short Term Goal 1 (Week 2): STG=LTG due to LOS  Skilled Therapeutic Interventions/Progress Updates:    Pt seen for OT session focusing on functional mobility from w/c level. Pt in supine upon arrival, agreeable to tx session. Transferred to EOB and into w/c via squat pivot at mod I level. In ADL apartment, practiced kitchen mobility from w/c level, discussed using reacher to access items, how to transport items and kitchen set-up for easier access. VCs throughout for locking w/c brakes during functional tasks. Pt believes he will only use crutches when absolutely necessary at d/c and will complete household tasks from w/c level, therefore declined practice using crutches in kitchen. Pt returned to room mod I from w/c level at end of session.   Therapy Documentation Precautions:  Precautions Precautions: Fall Required Braces or Orthoses: Other Brace/Splint Other Brace/Splint: Donned cam walker on LLE Restrictions Weight Bearing Restrictions: Yes RLE Weight Bearing: Weight bearing as tolerated LLE Weight Bearing: Weight bearing as tolerated ADL: ADL ADL Comments: Please see functional navigator  See Function Navigator for Current Functional Status.   Therapy/Group: Individual Therapy  Lewis, Tyrek Lawhorn C 05/27/2016, 7:14 AM

## 2016-05-27 NOTE — Progress Notes (Signed)
Physical Therapy Session Note  Patient Details  Name: Eric Schmitt MRN: [id number] Date of Birth: 03-Oct-1960  Today's Date: 05/27/2016 PT Individual Time: 800-900; 1400-1445 PT Individual Time Calculation (min): 60 min; 45 min   Short Term Goals: Week 1:  PT Short Term Goal 1 (Week 1): Pt will perform bed mobility with min assist  PT Short Term Goal 1 - Progress (Week 1): Met PT Short Term Goal 2 (Week 1): Pt will transfer with min assist and LRAD PT Short Term Goal 2 - Progress (Week 1): Met PT Short Term Goal 3 (Week 1): Pt will maintain standing position without significant change in BP x 5 minutes  PT Short Term Goal 3 - Progress (Week 1): Met PT Short Term Goal 4 (Week 1): Pt will ambulate 93f with mod assist and LRAD  PT Short Term Goal 4 - Progress (Week 1): Met PT Short Term Goal 5 (Week 1): Pt will propell WC x 1561fand min assist from PT.  PT Short Term Goal 5 - Progress (Week 1): Met Week 2:  PT Short Term Goal 1 (Week 2): =LTG due to estimated LOS  Skilled Therapeutic Interventions/Progress Updates:  Tx 1: Pt received seated in w/c with LLE elevated and MD in room discussing results of x-ray. CAM boot donned by pt's wife. Session interrupted by MD to discuss progress and nurse to give medications. Pt propelled w/c from pt's room to apartment to perform transfer into real bed. Pt's wife educated on safe technique for sit>>stand and guarding during ambulation and transfer. Pt ambulated 65f52from w/c to transfer into bed with min guard by pt's wife during ambulation and S for sit<>supine. Pt ambulated approximately 59f57fom apartment to rehab gym with min guard by pt's wife. Pt negotiated 8 3" stairs using crutches and min A from student PT with LLE ascending first. Pt required occasional cuing for foot placement when descending the stairs. Pt negotiated 3 6" stairs with min A by student PT and stand-by assist by wife in front of pt, per pt request. Pt did not complain of symptoms  while ascending the stairs, but once pt reached the top of the stairs, he became pale and began sweating, stating that he felt dizzy. Student PT requested assistance to help sit pt down. Pt was remained standing until a stool was brought to the top of the stairs for pt to sit on. Pt stated he was able to hear throughout this event but was slow to respond to questioning. After resting for approximately 5 mins, pt's symptoms had resolved and pt was able to descend 3" stairs using B rails and min A to return to sitting in his w/c. Recommended to pt's wife placing a chair at the top of the stairs at home for pt to sit in once getting to the top of the stairs. Pt left seated in w/c with his wife returning pt to his room and all needs met. Pt's RN alerted to presyncope event.   Tx2: Pt received seated in w/c. Pt reported feeling good and ready to negotiate stairs again. Pt propelled w/c from pt's room to rehab gym. Pt ascended 3 3" stairs using crutches with min A but chose to back down stairs to conserve energy in order to complete 6" stairs. Pt negotiated 3 6" stairs with min A. Pt ascended stairs slower compared to this morning and reported mild dizziness at the top of the stairs reported this dizziness being much less than this morning. After  resting at the top of the stairs in standing for 1-2 mins, pt descended stairs with min A and verbal cues for foot placement. Pt propelled w/c up ramp in ortho gym with S. Pt performed sit>>stand on mulch with S and ambulated 73f on uneven surface with min guard. Nustep x756m with focus on L knee ROM. Passive L knee ROM 63deg. Pt left seated in w/c in pt's room with all needs met.   Therapy Documentation Precautions:  Precautions Precautions: Fall Required Braces or Orthoses: Other Brace/Splint Other Brace/Splint: Donned cam walker on LLE Restrictions Weight Bearing Restrictions: Yes RLE Weight Bearing: Weight bearing as tolerated LLE Weight Bearing: Weight bearing  as tolerated   See Function Navigator for Current Functional Status.   Therapy/Group: Individual Therapy  CaAlysia Penna/23/2018, 4:49 PM

## 2016-05-27 NOTE — Progress Notes (Signed)
Late entry: 0909 05/27/16 nursing PT came to RN claims that patient "almost fainted" while they were on the stairs. Patient"came back" per PT claims they will take vital signs. [facility]

## 2016-05-27 NOTE — Progress Notes (Signed)
05/27/16 1131 NURSING Pam PA notified of EKG results and vital signs. Per Pam if patient will have another  syncopal episode to get EKG . Orders noted.

## 2016-05-27 NOTE — Progress Notes (Signed)
Occupational Therapy Session Note  Patient Details  Name: Eric Schmitt MRN: [id number] Date of Birth: 09-30-1960  Today's Date: 05/27/2016 OT Individual Time: 1000-1100 OT Individual Time Calculation (min): 60 min    Short Term Goals: Week 1:  OT Short Term Goal 1 (Week 1): Pt will complete toilet transfer with min A OT Short Term Goal 1 - Progress (Week 1): Met OT Short Term Goal 2 (Week 1): Pt will don pants with min A OT Short Term Goal 2 - Progress (Week 1): Met OT Short Term Goal 3 (Week 1): Pt will complete sit<>stand with Min A in preparation for ADL tasks OT Short Term Goal 3 - Progress (Week 1): Met  Skilled Therapeutic Interventions/Progress Updates:    Pt resting in bed upon arrival with wife present.  Pt engaged in bathing at shower level (ADL apartment) and dressing with sit<>stand from w/c.  Pt's wife provided appropriate level of supervision/assistance throughout session.  Pt completed all tasks without assistance and occasional verbal cues for safety awareness during transfers.  Recommended tub transfer bench for use at home.  Pt and wife verbalized understanding.  Pt propelled w/c back to room and transferred to bed at supervision level.  Focus on ongoing family education and discharge planning.   Therapy Documentation Precautions:  Precautions Precautions: Fall Required Braces or Orthoses: Other Brace/Splint Other Brace/Splint: Donned cam walker on LLE Restrictions Weight Bearing Restrictions: Yes RLE Weight Bearing: Weight bearing as tolerated LLE Weight Bearing: Weight bearing as tolerated Pain:  Pt denied pain  See Function Navigator for Current Functional Status.   Therapy/Group: Individual Therapy  Leroy Libman 05/27/2016, 12:09 PM
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
## 2016-05-27 NOTE — Progress Notes (Signed)
Patient and wife report that he had a presyncopal episode yesterday with therapy --"was pushing himself too much--using crutches on the stairs. They report he had another episode this am when he was unresponsive for a few minutes. Eyes open and he could hear others talking per reports. No chest pain or discomfort or SOB preceding but reports that he got sweaty and felt it coming. Will check EKG as well as labs for work up.

## 2016-05-28 ENCOUNTER — Inpatient Hospital Stay (HOSPITAL_COMMUNITY): Payer: BC Managed Care – PPO

## 2016-05-28 ENCOUNTER — Inpatient Hospital Stay (HOSPITAL_COMMUNITY): Payer: BC Managed Care – PPO | Admitting: Physical Therapy

## 2016-05-28 ENCOUNTER — Inpatient Hospital Stay (HOSPITAL_COMMUNITY): Payer: BC Managed Care – PPO | Admitting: *Deleted

## 2016-05-28 NOTE — Progress Notes (Signed)
Orthopedic Tech Progress Note Patient Details:  Eric Schmitt 09/09/1960 [id number]  Ortho Devices Type of Ortho Device: Crutches Ortho Device/Splint Interventions: Application   [name] 05/28/2016, 5:51 PM

## 2016-05-28 NOTE — Progress Notes (Signed)
Recreational Therapy Session Note  Patient Details  Name: Eric Schmitt MRN: [id number] Date of Birth: 06/16/1960 Today's Date: 05/28/2016  Pain: no c/o pain Skilled Therapeutic Interventions/Progress Updates: Education with pt & wife about use of leisure time post discharge including activity analysis with potential modifications.  Also discussed community reintegration and energy conservation. Both stated understanding and feel prepare for community pursuits & discharge.   Julian Medina 05/28/2016, 11:55 AM

## 2016-05-28 NOTE — Progress Notes (Signed)
Physical Therapy Discharge Summary  Patient Details  Name: Eric Schmitt MRN: [id number] Date of Birth: August 03, 1960  Today's Date: 05/28/2016 PT Individual Time: 1100-1200; 1445-1530  PT Individual Time Calculation (min): 60; 45 min    Patient has met 10 of 10 long term goals due to improved activity tolerance, improved balance, improved postural control, increased strength, increased range of motion, decreased pain and ability to compensate for deficits.  Patient to discharge at an ambulatory level Modified Independent.   Patient's care partner is independent to provide the necessary physical assistance at discharge.  Reasons goals not met: All LTGs met.  Recommendation:  Patient will benefit from ongoing skilled PT services in home health setting to continue to advance safe functional mobility, address ongoing impairments in strength, balance, range of motion, gait, and minimize fall risk.  Equipment: w/c and axillary crutches  Reasons for discharge: treatment goals met  Patient/family agrees with progress made and goals achieved: Yes  PT Discharge Precautions/Restrictions Precautions Precautions: Fall Required Braces or Orthoses: Other Brace/Splint Other Brace/Splint: CAM boot Restrictions Weight Bearing Restrictions: Yes RLE Weight Bearing: Weight bearing as tolerated LLE Weight Bearing: Weight bearing as tolerated Vital Signs Therapy Vitals Temp: 98.2 F (36.8 C) Temp Source: Oral Pulse Rate: 89 Resp: 18 BP: 107/86 Patient Position (if appropriate): Lying Oxygen Therapy SpO2: 100 % O2 Device: Not Delivered Pain Pain Assessment Pain Assessment: 0-10 Pain Score: 8  Patients Stated Pain Goal: 3 Pain Intervention(s): Medication (See eMAR) (vicodin 2 po) Vision/Perception  Perception Perception: Within Functional Limits  Cognition Overall Cognitive Status: Within Functional Limits for tasks assessed Arousal/Alertness: Awake/alert Orientation Level: Oriented  X4 Memory: Appears intact Awareness: Appears intact Problem Solving: Appears intact Safety/Judgment: Appears intact Sensation Sensation Light Touch: Appears Intact Stereognosis: Appears Intact Hot/Cold: Appears Intact Proprioception: Appears Intact Coordination Gross Motor Movements are Fluid and Coordinated: Yes Fine Motor Movements are Fluid and Coordinated: Yes Finger Nose Finger Test: [facility] Motor  Motor Motor: Within Functional Limits  Mobility Bed Mobility Bed Mobility: Supine to Sit;Rolling Right;Rolling Left;Sit to Supine Rolling Right: 6: Modified independent (Device/Increase time) Rolling Left: 6: Modified independent (Device/Increase time) Supine to Sit: 6: Modified independent (Device/Increase time) Sit to Supine: 6: Modified independent (Device/Increase time) Transfers Transfers: Yes Sit to Stand: 6: Modified independent (Device/Increase time);With armrests;Other/comment (crutches) Stand to Sit: 6: Modified independent (Device/Increase time);With armrests Stand Pivot Transfers: 6: Modified independent (Device/Increase time);With armrests;Other (comment) (crutches) Locomotion  Ambulation Ambulation: Yes Ambulation/Gait Assistance: 5: Supervision Ambulation Distance (Feet): 152 Feet Assistive device: Crutches Ambulation/Gait Assistance Details: Verbal cues for gait pattern;Verbal cues for sequencing Gait Gait: Yes Gait Pattern: Impaired Knee - Swing Phase- Impaired Gait Pattern: Decreased flexion - Left Hip - Swing Phase- Impaired Gait Pattern: Decreased flexion - Left Gait velocity: decreased Stairs / Additional Locomotion Stairs: Yes Stairs Assistance: 4: Min assist Stair Management Technique: With crutches Number of Stairs: 12 Height of Stairs:  (4 6", 8 3") Wheelchair Mobility Wheelchair Mobility: Yes Wheelchair Assistance: 6: Modified independent (Device/Increase time) Environmental health practitioner: Both upper extremities Wheelchair Parts Management:  Supervision/cueing Distance: >187f  Trunk/Postural Assessment  Cervical Assessment Cervical Assessment: Within Functional Limits Thoracic Assessment Thoracic Assessment: Within Functional Limits Lumbar Assessment Lumbar Assessment: Within Functional Limits Postural Control Postural Control: Within Functional Limits  Balance Static Sitting Balance Static Sitting - Balance Support: Feet unsupported;No upper extremity supported Static Sitting - Level of Assistance: 7: Independent Dynamic Sitting Balance Dynamic Sitting - Balance Support: Right upper extremity supported Dynamic Sitting - Level of Assistance: 6: Modified independent (  Device/Increase time) Extremity Assessment  LUE AROM (degrees) Overall AROM Left Upper Extremity: Other (comment) Left Shoulder Flexion: 90 Degrees RLE Assessment RLE Assessment: Within Functional Limits LLE Assessment LLE Assessment: Exceptions to WFL LLE PROM (degrees) Left Knee Flexion: 64deg LLE Strength LLE Overall Strength Comments: limited due to pain   Tx1: Pt received seated in w/c. Pt's wife assisted pt in donning CAM boot. Pt propelled w/c from pt's room to ortho gym for car transfer. Pt min A for LLE management with car transfer to vehicle at sedan height using crutches. Pt expressed concern about transferring into his real car, so pt told he could practice this during next session. Pt ambulated 41f on uneven surface with pt's wife providing min guard. Pt able to safely maneuver around obstacles in w/c without verbal cues. Pt's wife educated on technique for stair negotiation and how to assist pt with verbal instructions and demonstration. Pt ascended 8 3" stairs and descended 4 6" stairs with min A from pt's wife and using crutches. After resting in the w/c for a few minutes, pt ascended 4 6" stairs and descended 8 3" stairs with min A with crutches. Pt reported no symptoms today while negotiated stairs but was given verbal cues to go slowly and  watch his R foot placement when descending stairs. Pt mod I with stand pivot transfer from w/c<> bed using crutches and required no assistance or cuing with sit<>supine. Pt ambulated 1069fwith S. Pt left seated in bed with all needs met.   Tx2: Pt received supine in bed. Pt mod I transfer from bed to w/c using crutches. Pt taken outside to perform car transfer into pt's car. Pt S with pt's wife for car transfer using crutches, with no assistance needed for LLE management. Pt taken back inside, where he ambulated 15280fsing crutches with S. Pt signed off to be mod I in pt's room. Nustep x6 min with focus on L knee flexion ROM. L knee flexion PROM measured to be 64deg. Pt propelled w/c from rehab gym to pt's room. Pt left seated in w/c with all needs met.   See Function Navigator for Current Functional Status.  CarAlysia Penna24/2018, 4:15 PM

## 2016-05-28 NOTE — Progress Notes (Signed)
Recreational Therapy Discharge Summary Patient Details  Name: Eric MaduraMark Schmitt MRN: [id number] Date of Birth: 11/10/1960 Today's Date: 05/28/2016 Comments on progress toward goals: Pt has made great progress during LOS and is ready for discharge home at Mod I w/c level.  Education provided on energy conservation, community reintegration & leisure pursuits. Pt is motivated and already has a plan for use of his time at discharge.Reasons for discharge: discharge from hospital  Patient/family agrees with progress made and goals achieved: Yes  Eliezer Khawaja 05/28/2016, 11:52 AM

## 2016-05-28 NOTE — Progress Notes (Signed)
Occupational Therapy Note  Patient Details  Name: Eric MaduraMark Schmitt MRN: [id number] Date of Birth: 12/07/1960  Today's Date: 05/28/2016 OT Individual Time: 1400-1430 OT Individual Time Calculation (min): 30 min   Pt c/o 5/10 pain in LLE; RN aware and repositioned Individual Therapy  Pt resting in bed upon arrival with wife present.  Pt transitioned to ADL apartment/kitchen for ongoing education and home safety recommendations.  Pt and wife verbalized understanding of all recommendations.  Pt's wife informed therapist that she had purchased a tub transfer bench to use at home for bathing.  Pt returned to room used toilet before returning to bed awaiting next therapy.    Eric Schmitt, Eric Schmitt [facility] 05/28/2016, 2:38 PM

## 2016-05-28 NOTE — Progress Notes (Signed)
Occupational Therapy Discharge Summary  Patient Details  Name: Eric Schmitt MRN: [id number] Date of Birth: [birth year]  Patient has met 9 of 9 long term goals due to improved activity tolerance, improved balance, postural control, ability to compensate for deficits and improved coordination.  Pt made steady progress with BADLs during this admission.  Pt performs all BADLs at mod I level and supervision level for tub bench transfers.  Pt exhibits no unsafe behaviors.  Pt's wife has been present and participated in therapy sessions. Patient to discharge at overall Modified Independent level.  Patient's care partner is independent to provide the necessary physical assistance at discharge.    Recommendation:  Patient will benefit from ongoing skilled OT services in home health setting to continue to advance functional skills in the area of BADL and iADL.  Equipment: Pt's wife purchased tub transfer bench  Reasons for discharge: treatment goals met and discharge from hospital  Patient/family agrees with progress made and goals achieved: Yes  OT Discharge ADL ADL ADL Comments: Please see functional navigator Vision Baseline Vision/History: No visual deficits Patient Visual Report: No change from baseline Vision Assessment?: No apparent visual deficits Cognition Overall Cognitive Status: Within Functional Limits for tasks assessed Arousal/Alertness: Awake/alert Orientation Level: Oriented X4 Memory: Appears intact Awareness: Appears intact Problem Solving: Appears intact Safety/Judgment: Appears intact Sensation Sensation Light Touch: Appears Intact Stereognosis: Appears Intact Hot/Cold: Appears Intact Proprioception: Appears Intact Coordination Gross Motor Movements are Fluid and Coordinated: Yes Fine Motor Movements are Fluid and Coordinated: Yes Finger Nose Finger Test: [facility] Trunk/Postural Assessment  Cervical Assessment Cervical Assessment: Within Functional Limits Thoracic  Assessment Thoracic Assessment: Within Functional Limits Lumbar Assessment Lumbar Assessment: Within Functional Limits Postural Control Postural Control: Within Functional Limits  Balance Static Sitting Balance Static Sitting - Balance Support: Feet unsupported;No upper extremity supported Static Sitting - Level of Assistance: 7: Independent Dynamic Sitting Balance Dynamic Sitting - Balance Support: Right upper extremity supported Dynamic Sitting - Level of Assistance: 6: Modified independent (Device/Increase time) Extremity/Trunk Assessment RUE Assessment RUE Assessment: Within Functional Limits LUE AROM (degrees) Overall AROM Left Upper Extremity: Other (comment) Left Shoulder Flexion: 90 Degrees   See Function Navigator for Current Functional Status.  Leotis Shames [facility] 05/28/2016, 3:08 PM
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
## 2016-05-28 NOTE — Progress Notes (Signed)
Occupational Therapy Session Note  Patient Details  Name: Eric Schmitt MRN: [id number] Date of Birth: [birth year]  Today's Date: 05/28/2016 OT Individual Time: 1000-1100 OT Individual Time Calculation (min): 60 min    Short Term Goals: Week 1:  OT Short Term Goal 1 (Week 1): Pt will complete toilet transfer with min A OT Short Term Goal 1 - Progress (Week 1): Met OT Short Term Goal 2 (Week 1): Pt will don pants with min A OT Short Term Goal 2 - Progress (Week 1): Met OT Short Term Goal 3 (Week 1): Pt will complete sit<>stand with Min A in preparation for ADL tasks OT Short Term Goal 3 - Progress (Week 1): Met  Skilled Therapeutic Interventions/Progress Updates:    Pt's wife present for ongoing education.  Pt engaged in bathing/dressing tasks.  Pt completed tub bench transfer at supervision level and completed all bathing/dressing tasks at mod I/I level.  Pt and wife pleased with progress and ready for discharge tomorrow. Pt returned to room and transferred to bed awaiting his next therapy.  Therapy Documentation Precautions:  Precautions Precautions: Fall Required Braces or Orthoses: Other Brace/Splint Other Brace/Splint: Donned cam walker on LLE Restrictions Weight Bearing Restrictions: Yes RLE Weight Bearing: Weight bearing as tolerated LLE Weight Bearing: Weight bearing as tolerated Pain: Pain Assessment Pain Score: 4 LLE RN aware and repositioned  See Function Navigator for Current Functional Status.   Therapy/Group: Individual Therapy  Leroy Libman 05/28/2016, 11:29 AM

## 2016-05-29 DIAGNOSIS — K5900 Constipation, unspecified: Secondary | ICD-10-CM

## 2016-05-29 MED ORDER — FERROUS SULFATE 325 (65 FE) MG PO TABS: 325.0000 mg | ORAL_TABLET | Freq: Every day | ORAL | 0 refills | Status: DC

## 2016-05-29 MED ORDER — TRAMADOL HCL 50 MG PO TABS: 50.0000 mg | ORAL_TABLET | Freq: Four times a day (QID) | ORAL | 0 refills | Status: DC | PRN

## 2016-05-29 MED ORDER — CEPHALEXIN 500 MG PO CAPS: 500.0000 mg | ORAL_CAPSULE | Freq: Three times a day (TID) | ORAL | 0 refills | Status: DC

## 2016-05-29 MED ORDER — MULTI-VITAMIN/MINERALS PO TABS: 1.0000 | ORAL_TABLET | Freq: Every day | ORAL | Status: AC

## 2016-05-29 MED ORDER — SENNOSIDES-DOCUSATE SODIUM 8.6-50 MG PO TABS: 1.0000 | ORAL_TABLET | Freq: Every day | ORAL | 0 refills | Status: DC

## 2016-05-29 MED ORDER — METHOCARBAMOL 500 MG PO TABS: 500.0000 mg | ORAL_TABLET | Freq: Four times a day (QID) | ORAL | 0 refills | Status: DC | PRN

## 2016-05-29 MED ORDER — TRAZODONE HCL 50 MG PO TABS: 25.0000 mg | ORAL_TABLET | Freq: Every evening | ORAL | 0 refills | Status: DC | PRN

## 2016-05-29 MED ORDER — ASPIRIN EC 325 MG PO TBEC: 325.0000 mg | DELAYED_RELEASE_TABLET | Freq: Every day | ORAL | 0 refills | Status: AC

## 2016-05-29 MED ORDER — HYDROCODONE-ACETAMINOPHEN 5-325 MG PO TABS: 1.0000 | ORAL_TABLET | Freq: Four times a day (QID) | ORAL | 0 refills | Status: DC | PRN

## 2016-05-29 MED ORDER — IBUPROFEN 400 MG PO TABS: 400.0000 mg | ORAL_TABLET | Freq: Four times a day (QID) | ORAL | 0 refills | Status: DC | PRN
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
## 2016-05-29 NOTE — Discharge Instructions (Signed)
Inpatient Rehab Discharge Instructions  Adine MaduraMark Delahoussaye Discharge date and time:  05/29/16  Activities/Precautions/ Functional Status: Activity: activity as tolerated and no driving till cleared by MD.  Diet: regular diet Wound Care: Cover wound on left shin when showering. Cleanse with betadine and keep dry dressing in place--change as needed.  Contact MD if you develop any problems with your incision/wound--redness, swelling, increase in pain, increase in drainage or if you develop fever or chills.   Functional status:  ___ No restrictions     ___ Walk up steps independently ___ 24/7 supervision/assistance   ___ Walk up steps with assistance _X__ Intermittent supervision/assistance  ___ Bathe/dress independently ___ Walk with walker     ___ Bathe/dress with assistance ___ Walk Independently    ___ Shower independently ___ Walk with assistance    _X__ Shower with assistance _X__ No alcohol     ___ Return to work/school ________   COMMUNITY REFERRALS UPON DISCHARGE:   Home Health:   PT     OT     RN    Agency:  [facility] Phone:  [phone] Medical Equipment/Items Ordered:  18"x18" lightweight wheelchair with elevating leg rests and basic cushion;  Your wife bought your tub bench already.  Crutches ordered through ortho tech.  Agency/Supplier:  [facility]          Phone:  [phone]  Special Instructions: 1. Partial to touch down weight on left leg.    Opioid Overdose Opioids are substances that relieve pain by binding to pain receptors in your brain and spinal cord. Opioids include illegal drugs, such as heroin, as well as prescription pain medicines.An opioid overdose happens when you take too much of an opioid substance. This can happen with any type of opioid, including:  Heroin.  Morphine.  Codeine.  Methadone.  Oxycodone.  Hydrocodone.  Fentanyl.  Hydromorphone.  Buprenorphine. The effects of an overdose can be mild, dangerous, or even  deadly. Opioid overdose is a medical emergency. What are the causes? This condition may be caused by:  Taking too much of an opioid by accident.  Taking too much of an opioid on purpose.  An error made by a health care provider who prescribes a medicine.  An error made by the pharmacist who fills the prescription order.  Using more than one substance that contains opioids at the same time.  Mixing an opioid with a substance that affects your heart, breathing, or blood pressure. These include alcohol, tranquilizers, sleeping pills, illegal drugs, and some over-the-counter medicines. What increases the risk? This condition is more likely in:  Children. They may be attracted to colorful pills. Because of a child's small size, even a small amount of a drug can be dangerous.  Elderly people. They may be taking many different drugs. Elderly people may have difficulty reading labels or remembering when they last took their medicine.  People who take an opioid on a long-term basis.  People who use:  Illegal drugs.  Other substances, including alcohol, while using an opioid.  People who have:  A history of drug or alcohol abuse.  Certain mental health conditions.  People who take opioids that are not prescribed for them. What are the signs or symptoms? Symptoms of this condition depend on the type of opioid and the amount that was taken. Common symptoms include:  Sleepiness or difficulty waking from sleep.  Confusion.  Slurred speech.  Slowed breathing and a slow pulse.  Nausea and vomiting.  Abnormally  small pupils. Signs and symptoms that require emergency treatment include:  Cold, clammy, and pale skin.  Blue lips and fingernails.  Vomiting.  Gurgling sounds in the throat.  A pulse that is very slow or difficult to detect.  Breathing that is very slow, noisy, or difficult to detect.  Limp body.  Inability to respond to speech or be awakened from sleep  (stupor). How is this diagnosed? This condition is diagnosed based on your symptoms. It is important to tell your health care provider:  All of the opioidsthat you took.  When you took the opioids.  Whether you were drinking alcohol or using other substances. Your health care provider will do a physical exam. This exam may include:  Checking and monitoring your heart rate and rhythm, your breathing rate and depth, your temperature, and your blood pressure (vital signs).  Checking for abnormally small pupils.  Measuring oxygen levels in your blood. You may also have blood tests or urine tests. How is this treated? Supporting your vital signs and your breathing is the first step in treating an opioid overdose. Treatment may also include:  Giving fluids and minerals (electrolytes) through an IV tube.  Inserting a breathing tube (endotracheal tube) in your airway to help you breathe.  Giving oxygen.  Passing a tube through your nose and into your stomach (NG tube, or nasogastric tube) to wash out your stomach.  Giving medicines that:  Increase your blood pressure.  Absorb any opioid that is in your digestive system.  Reverse the effects of the opioid (naloxone).  Ongoing counseling and mental health support if you intentionally overdosed or used an illegal drug. Follow these instructions at home:  Take over-the-counter and prescription medicines only as told by your health care provider. Always ask your health care provider about possible side effects and interactions of any new medicine that you start taking.  Keep a list of all of the medicines that you take, including over-the-counter medicines. Bring this list with you to all of your medical visits.  Drink enough fluid to keep your urine clear or pale yellow.  Keep all follow-up visits as told by your health care provider. This is important. How is this prevented?  Get help if you are struggling with:  Alcohol or  drug use.  Depression or another mental health problem.  Keep the phone number of your local poison control center near your phone or on your cell phone.  Store all medicines in safety containers that are out of the reach of children.  Read the drug inserts that come with your medicines.  Do not drink alcohol when taking opioids.  Do not use illegal drugs.  Do not take opioid medicines that are not prescribed for you. Contact a health care provider if:  Your symptoms return.  You develop new symptoms or side effects when you are taking medicines. Get help right away if:  You think that you or someone else may have taken too much of an opioid. The hotline of the [facility] is 302-829-3928.  You or someone else is having symptoms of an opioid overdose.  You have serious thoughts about hurting yourself or others.  You have:  Chest pain.  Difficulty breathing.  A loss of consciousness. Opioid overdose is an emergency. Do not wait to see if the symptoms will go away. Get medical help right away. Call your local emergency services (911 in the [place] Do not drive yourself to the hospital. This information  is not intended to replace advice given to you by your health care provider. Make sure you discuss any questions you have with your health care provider. Document Released: 01/30/2004 Document Revised: 05/30/2015 Document Reviewed: 06/07/2014 Elsevier Interactive Patient Education  2017 [employer].    My questions have been answered and I understand these instructions. I will adhere to these goals and the provided educational materials after my discharge from the hospital.  Patient/Caregiver Signature _______________________________ Date __________  Clinician Signature _______________________________________ Date __________  Please bring this form and your medication list with you to all your follow-up doctor's appointments.

## 2016-05-29 NOTE — Progress Notes (Signed)
Pt A/O, no noted distress. Pt/spouse has dressing changes supplies. Pt was educated on discharge instructions and follow up appointments. Pt has all personal belongings. Staff safely transferred to car.
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
## 2016-05-29 NOTE — Progress Notes (Signed)
Social Work Discharge Note  The overall goal for the admission was met for:   Discharge location: Yes - home with wife  Length of Stay: Yes  Discharge activity level: Yes - modified independent  Home/community participation: Yes  Services provided included: MD, RD, PT, OT, SLP, RN, TR, Pharmacy, Neuropsych and SW  Financial Services: Private Insurance: [place] [employer]  Follow-up services arranged: Home Health: RN/PT/OT, DME: 18"x18" w/c with elevating leg rests and basic cushion; crutches, wife bought tub bench already and Patient[facility]  Comments (or additional information): Pt's wife to assist at home as needed.  CSW set pt up with new PCP near his home.    Patient/Family verbalized understanding of follow-up arrangements: Yes  Individual responsible for coordination of the follow-up plan: pt and wife  Confirmed correct DME delivered: Trey Sailors 05/29/2016    Shayra Anton, Silvestre Mesi

## 2016-05-29 NOTE — Progress Notes (Signed)
Social Work Patient ID: Eric Schmitt, male   DOB: 01/17/1960, 56 y.o.   MRN: [id number]   CSW spoke with pt and his wife after team conference to update them on discussion and to solidify d/c needs.  [facility]  CSW also ordered pt DME.  Gave pt completed and signed handicap placard application.  Pt/wife feel prepared to go home and wife going through education.  No other needs identified at this time.  Support given and CSW will continue to follow.
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
## 2016-06-02 ENCOUNTER — Encounter: Payer: BC Managed Care – PPO | Admitting: Physical Medicine & Rehabilitation

## 2016-06-05 ENCOUNTER — Ambulatory Visit (INDEPENDENT_AMBULATORY_CARE_PROVIDER_SITE_OTHER): Payer: BC Managed Care – PPO | Admitting: Physician Assistant

## 2016-06-05 ENCOUNTER — Encounter: Payer: Self-pay | Admitting: Emergency Medicine

## 2016-06-05 ENCOUNTER — Encounter: Payer: Self-pay | Admitting: Physician Assistant

## 2016-06-05 VITALS — BP 120/80 | HR 83 | Temp 97.7°F | Resp 14 | Ht 70.0 in | Wt 172.0 lb

## 2016-06-05 DIAGNOSIS — K5900 Constipation, unspecified: Secondary | ICD-10-CM

## 2016-06-05 DIAGNOSIS — D62 Acute posthemorrhagic anemia: Secondary | ICD-10-CM

## 2016-06-05 DIAGNOSIS — S82202S Unspecified fracture of shaft of left tibia, sequela: Secondary | ICD-10-CM

## 2016-06-05 DIAGNOSIS — S82402S Unspecified fracture of shaft of left fibula, sequela: Secondary | ICD-10-CM | POA: Diagnosis not present

## 2016-06-05 LAB — CBC
HCT: 36 % — ABNORMAL LOW (ref 39.0–52.0)
HEMOGLOBIN: 12.1 g/dL — AB (ref 13.0–17.0)
MCHC: 33.6 g/dL (ref 30.0–36.0)
MCV: 95 fl (ref 78.0–100.0)
PLATELETS: 366 10*3/uL (ref 150.0–400.0)
RBC: 3.79 Mil/uL — ABNORMAL LOW (ref 4.22–5.81)
RDW: 14.2 % (ref 11.5–15.5)
WBC: 5.6 10*3/uL (ref 4.0–10.5)

## 2016-06-05 MED ORDER — KETOROLAC TROMETHAMINE 60 MG/2ML IM SOLN
60.0000 mg | Freq: Once | INTRAMUSCULAR | Status: AC
  Administered 2016-06-05: 60 mg via INTRAMUSCULAR
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
## 2016-06-05 NOTE — Progress Notes (Signed)
Pre visit review using our clinic review tool, if applicable. No additional management support is needed unless otherwise documented below in the visit note. 

## 2016-06-05 NOTE — Patient Instructions (Addendum)
Please continue multivitamin, aspirin, 2 tylenol (regular strength), iron, and antibiotic in the morning. Add on a Tramadol at 7 AM.  At 1, take your antibiotic and a hydrocodone. At [facility]7PM take your antibiotic, flexeril (muscle relaxant) At bedtime, take your trazodone and hydocodone.  Do not take both Robaxin and Flexeril. Stop the Robaxin.  I encourage you to increase hydration and the amount of fiber in your diet.  Start a daily probiotic (Align, Culturelle, Digestive Advantage, etc.). If no bowel movement within 24 hours, take 2 Tbs of Milk of Magnesia in a 4 oz glass of warmed prune juice every 2-3 days to help promote bowel movement. If no results within 24 hours, then repeat above regimen, adding a Dulcolax stool softener to regimen. If this does not promote a bowel movement, please call the office.  Follow-up with Dr. Eulah PontMurphy as scheduled next week.

## 2016-07-07 ENCOUNTER — Ambulatory Visit (INDEPENDENT_AMBULATORY_CARE_PROVIDER_SITE_OTHER): Payer: BC Managed Care – PPO | Admitting: Physician Assistant

## 2016-07-07 ENCOUNTER — Encounter: Payer: Self-pay | Admitting: Physician Assistant

## 2016-07-07 VITALS — BP 122/80 | HR 76 | Temp 98.1°F | Resp 14 | Ht 70.0 in | Wt 175.0 lb

## 2016-07-07 DIAGNOSIS — S3022XD Contusion of scrotum and testes, subsequent encounter: Secondary | ICD-10-CM | POA: Diagnosis not present

## 2016-07-07 DIAGNOSIS — Z125 Encounter for screening for malignant neoplasm of prostate: Secondary | ICD-10-CM

## 2016-07-07 DIAGNOSIS — N509 Disorder of male genital organs, unspecified: Secondary | ICD-10-CM | POA: Diagnosis not present

## 2016-07-07 DIAGNOSIS — N5089 Other specified disorders of the male genital organs: Secondary | ICD-10-CM

## 2016-07-07 LAB — PSA: PSA: 1.05 ng/mL (ref 0.10–4.00)
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
## 2016-07-07 NOTE — Patient Instructions (Signed)
Please go to the lab for a PSA blood test (prosate cancer screening).  Your prostate examination today was unremarkable which is good.  Then go to the front desk to speak with Marylene Landngela to schedule the Ultrasound of your scrotum/testicles as this needs further assessment. I will call as soon as I have these results. Please let me know if you develop any pain or discomfort in this area.

## 2016-07-07 NOTE — Progress Notes (Signed)
Pre visit review using our clinic review tool, if applicable. No additional management support is needed unless otherwise documented below in the visit note. 

## 2016-07-15 ENCOUNTER — Ambulatory Visit
Admission: RE | Admit: 2016-07-15 | Discharge: 2016-07-15 | Disposition: A | Discharge status: Home / Self Care | Payer: BC Managed Care – PPO | Source: Ambulatory Visit | Attending: Physician Assistant | Admitting: Physician Assistant

## 2016-07-15 DIAGNOSIS — N5089 Other specified disorders of the male genital organs: Secondary | ICD-10-CM

## 2016-07-15 DIAGNOSIS — S3022XD Contusion of scrotum and testes, subsequent encounter: Secondary | ICD-10-CM

## 2016-07-21 ENCOUNTER — Telehealth: Payer: Self-pay | Admitting: *Deleted

## 2016-07-21 NOTE — Telephone Encounter (Signed)
Patient called to get his lab results, Please advise [phone]

## 2016-07-21 NOTE — Telephone Encounter (Signed)
Called patient back at the provided number given. LMOVM about results and if he is agreeable with recommendations. See results

## 2016-07-21 NOTE — Telephone Encounter (Signed)
Results given to patient.  See result note  

## 2016-07-22 DIAGNOSIS — S92312D Displaced fracture of first metatarsal bone, left foot, subsequent encounter for fracture with routine healing: Secondary | ICD-10-CM | POA: Diagnosis not present

## 2016-07-22 DIAGNOSIS — S3282XD Multiple fractures of pelvis without disruption of pelvic ring, subsequent encounter for fracture with routine healing: Secondary | ICD-10-CM | POA: Diagnosis not present

## 2016-07-22 DIAGNOSIS — Z7982 Long term (current) use of aspirin: Secondary | ICD-10-CM | POA: Diagnosis not present

## 2016-07-22 DIAGNOSIS — S3022XD Contusion of scrotum and testes, subsequent encounter: Secondary | ICD-10-CM | POA: Diagnosis not present

## 2016-07-22 DIAGNOSIS — Z79891 Long term (current) use of opiate analgesic: Secondary | ICD-10-CM | POA: Diagnosis not present

## 2016-07-22 DIAGNOSIS — D62 Acute posthemorrhagic anemia: Secondary | ICD-10-CM | POA: Diagnosis not present

## 2016-07-22 DIAGNOSIS — S82192D Other fracture of upper end of left tibia, subsequent encounter for closed fracture with routine healing: Secondary | ICD-10-CM | POA: Diagnosis not present

## 2017-03-31 ENCOUNTER — Ambulatory Visit: Payer: BC Managed Care – PPO | Admitting: Physician Assistant

## 2018-01-04 ENCOUNTER — Ambulatory Visit: Payer: BC Managed Care – PPO | Admitting: Physician Assistant

## 2018-01-04 ENCOUNTER — Other Ambulatory Visit: Payer: Self-pay

## 2018-01-04 ENCOUNTER — Encounter: Payer: Self-pay | Admitting: Physician Assistant

## 2018-01-04 VITALS — BP 118/80 | HR 65 | Temp 97.7°F | Resp 16 | Ht 70.0 in | Wt 194.0 lb

## 2018-01-04 DIAGNOSIS — H00012 Hordeolum externum right lower eyelid: Secondary | ICD-10-CM

## 2018-01-04 MED ORDER — ERYTHROMYCIN 5 MG/GM OP OINT: 1.0000 "application " | TOPICAL_OINTMENT | Freq: Three times a day (TID) | OPHTHALMIC | 0 refills | Status: DC

## 2018-01-04 NOTE — Progress Notes (Signed)
Patient presents to clinic today c/o 2 months of a bump on lower R eyelid. Was initially red and painful but these resolved some time ago with warm compresses. Notes the area has been getting bigger. Denies vision changes. Denies trauma or injury. Denies dry eye.   History reviewed. No pertinent past medical history.  Current Outpatient Medications on File Prior to Visit  Medication Sig Dispense Refill  . aspirin EC 325 MG tablet Take 1 tablet (325 mg total) by mouth daily. For 30 days post op for DVT Prophylaxis 30 tablet 0  . Multiple Vitamins-Minerals (MULTIVITAMIN WITH MINERALS) tablet Take 1 tablet by mouth daily.     No current facility-administered medications on file prior to visit.     Allergies  Allergen Reactions  . Oxycodone Hcl Nausea And Vomiting    Family History  Problem Relation Age of Onset  . Healthy Mother   . Healthy Father     Social History   Socioeconomic History  . Marital status: Married    Spouse name: Eric Schmitt  . Number of children: Not on file  . Years of education: Not on file  . Highest education level: Not on file  Occupational History  . Occupation: Runner, broadcasting/film/videoTeacher  Social Needs  . Financial resource strain: Not on file  . Food insecurity:    Worry: Not on file    Inability: Not on file  . Transportation needs:    Medical: Not on file    Non-medical: Not on file  Tobacco Use  . Smoking status: Never Smoker  . Smokeless tobacco: Never Used  Substance and Sexual Activity  . Alcohol use: Yes    Comment: OCCASIONAL  . Drug use: No  . Sexual activity: Yes  Lifestyle  . Physical activity:    Days per week: Not on file    Minutes per session: Not on file  . Stress: Not on file  Relationships  . Social connections:    Talks on phone: Not on file    Gets together: Not on file    Attends religious service: Not on file    Active member of club or organization: Not on file    Attends meetings of clubs or organizations: Not on file   Relationship status: Not on file  Other Topics Concern  . Not on file  Social History Narrative  . Not on file   Review of Systems - See HPI.  All other ROS are negative.  BP 118/80   Pulse 65   Temp 97.7 F (36.5 C) (Oral)   Resp 16   Ht 5\' 10"  (1.778 m)   Wt 194 lb (88 kg)   SpO2 97%   BMI 27.84 kg/m   Physical Exam Vitals signs reviewed.  Constitutional:      Appearance: Normal appearance.  HENT:     Head: Normocephalic and atraumatic.     Right Ear: Tympanic membrane normal.     Left Ear: Tympanic membrane normal.     Nose: Nose normal.  Eyes:     General: Lids are normal. Lids are everted, no foreign bodies appreciated. Vision grossly intact.        Right eye: Hordeolum (lower eye lid -- about 1 cm in diameter) present.     Extraocular Movements: Extraocular movements intact.     Right eye: Normal extraocular motion and no nystagmus.     Left eye: Normal extraocular motion and no nystagmus.     Conjunctiva/sclera:     Right  eye: Right conjunctiva is not injected. No exudate.    Pupils: Pupils are equal, round, and reactive to light.  Neck:     Musculoskeletal: Normal range of motion and neck supple.  Cardiovascular:     Rate and Rhythm: Normal rate and regular rhythm.     Pulses: Normal pulses.     Heart sounds: Normal heart sounds.  Pulmonary:     Effort: Pulmonary effort is normal.     Breath sounds: Normal breath sounds.  Neurological:     Mental Status: He is alert.    Assessment/Plan: 1. Hordeolum externum of right lower eyelid Present > 1 month. No active infection but not resolving with warm compresses. Supportive measures reviewed. Rx Romycin to have on hand in case signs of infection recur -- these were reviewed with patient. Referral to Ophthalmology placed for potential removal. - Ambulatory referral to Ophthalmology   Piedad ClimesWilliam Cody Malloree Raboin, PA-C

## 2018-01-04 NOTE — Patient Instructions (Signed)
Please continue warm compresses to the eye. Avoid squeezing the area.   You will be contacted for assessment by Ophthalmology for removal.  If you note recurrence of redness or tenderness, you can start the antibiotic sent to pharmacy and take as directed.   Stye  A stye, also known as a hordeolum, is a bump that forms on an eyelid. It may look like a pimple next to the eyelash. A stye can form inside the eyelid (internal stye) or outside the eyelid (external stye). A stye can cause redness, swelling, and pain on the eyelid. Styes are very common. Anyone can get them at any age. They usually occur in just one eye, but you may have more than one in either eye. What are the causes? A stye is caused by an infection. The infection is almost always caused by bacteria called Staphylococcus aureus. This is a common type of bacteria that lives on the skin. An internal stye may result from an infected oil-producing gland inside the eyelid. An external stye may be caused by an infection at the base of the eyelash (hair follicle). What increases the risk? You are more likely to develop a stye if:  You have had a stye before.  You have any of these conditions: ? Diabetes. ? Red, itchy, inflamed eyelids (blepharitis). ? A skin condition such as seborrheic dermatitis or rosacea. ? High fat levels in your blood (lipids). What are the signs or symptoms? The most common symptom of a stye is eyelid pain. Internal styes are more painful than external styes. Other symptoms may include:  Painful swelling of your eyelid.  A scratchy feeling in your eye.  Tearing and redness of your eye.  Pus draining from the stye. How is this diagnosed? Your health care provider may be able to diagnose a stye just by examining your eye. The health care provider may also check to make sure:  You do not have a fever or other signs of a more serious infection.  The infection has not spread to other parts of your eye  or areas around your eye. How is this treated? Most styes will clear up in a few days without treatment or with warm compresses applied to the area. You may need to use antibiotic drops or ointment to treat an infection. In some cases, if your stye does not heal with routine treatment, your health care provider may drain pus from the stye using a thin blade or needle. This may be done if the stye is large, causing a lot of pain, or affecting your vision. Follow these instructions at home:  Take over-the-counter and prescription medicines only as told by your health care provider. This includes eye drops or ointments.  If you were prescribed an antibiotic medicine, apply or use it as told by your health care provider. Do not stop using the antibiotic even if your condition improves.  Apply a warm, wet cloth (warm compress) to your eye for 5-10 minutes, 4 times a day.  Clean the affected eyelid as directed by your health care provider.  Do not wear contact lenses or eye makeup until your stye has healed.  Do not try to pop or drain the stye.  Do not rub your eye. Contact a health care provider if:  You have chills or a fever.  Your stye does not go away after several days.  Your stye affects your vision.  Your eyeball becomes swollen, red, or painful. Get help right away if:  You have pain when moving your eye around. Summary  A stye is a bump that forms on an eyelid. It may look like a pimple next to the eyelash.  A stye can form inside the eyelid (internal stye) or outside the eyelid (external stye). A stye can cause redness, swelling, and pain on the eyelid.  Your health care provider may be able to diagnose a stye just by examining your eye.  Apply a warm, wet cloth (warm compress) to your eye for 5-10 minutes, 4 times a day. This information is not intended to replace advice given to you by your health care provider. Make sure you discuss any questions you have with your  health care provider. Document Released: 10/01/2004 Document Revised: 09/03/2016 Document Reviewed: 09/03/2016 Elsevier Interactive Patient Education  2019 [employer]

## 2018-01-06 ENCOUNTER — Ambulatory Visit: Payer: BC Managed Care – PPO | Admitting: Family Medicine

## 2018-01-06 ENCOUNTER — Other Ambulatory Visit (HOSPITAL_COMMUNITY)
Admission: RE | Admit: 2018-01-06 | Discharge: 2018-01-06 | Disposition: A | Discharge status: Home / Self Care | Payer: BC Managed Care – PPO | Source: Ambulatory Visit | Attending: Family Medicine | Admitting: Family Medicine

## 2018-01-06 ENCOUNTER — Encounter: Payer: Self-pay | Admitting: Family Medicine

## 2018-01-06 ENCOUNTER — Other Ambulatory Visit: Payer: Self-pay

## 2018-01-06 VITALS — BP 120/84 | HR 64 | Temp 98.0°F | Resp 16 | Ht 70.0 in | Wt 195.4 lb

## 2018-01-06 DIAGNOSIS — Z202 Contact with and (suspected) exposure to infections with a predominantly sexual mode of transmission: Secondary | ICD-10-CM | POA: Diagnosis not present

## 2018-01-06 DIAGNOSIS — R21 Rash and other nonspecific skin eruption: Secondary | ICD-10-CM

## 2018-01-06 MED ORDER — VALACYCLOVIR HCL 1 G PO TABS: 1000.0000 mg | ORAL_TABLET | Freq: Two times a day (BID) | ORAL | 1 refills | Status: DC
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
## 2018-01-06 NOTE — Patient Instructions (Addendum)
Please return for your annual complete physical; please come fasting. With Malva Cogan, PA at your convenience.    If you have any questions or concerns, please don't hesitate to send me a message via MyChart or call the office at [phone]. Thank you for visiting with [place] today! It's our pleasure caring for you.  I will treat you today for a presumed herpes genitalis infection with Valtrex. Complete therapy as recommended.   I will release your lab results to you on your MyChart account with further instructions. Please reply with any questions.    Genital Herpes Genital herpes is a common sexually transmitted infection (STI) that is caused by a virus. The virus spreads from person to person through sexual contact. Infection can cause itching, blisters, and sores around the genitals or rectum. Symptoms may last several days and then go away This is called an outbreak. However, the virus remains in your body, so you may have more outbreaks in the future. The time between outbreaks varies and can be months or years. Genital herpes affects men and women. It is particularly concerning for pregnant women because the virus can be passed to the baby during delivery and can cause serious problems. Genital herpes is also a concern for people who have a weak disease-fighting (immune) system. What are the causes? This condition is caused by the herpes simplex virus (HSV) type 1 or type 2. The virus may spread through:  Sexual contact with an infected person, including vaginal, anal, and oral sex.  Contact with fluid from a herpes sore.  The skin. This means that you can get herpes from an infected partner even if he or she does not have a visible sore or does not know that he or she is infected. What increases the risk? You are more likely to develop this condition if:  You have sex with many partners.  You do not use latex condoms during sex. What are the signs or symptoms? Most people do not  have symptoms (asymptomatic) or have mild symptoms that may be mistaken for other skin problems. Symptoms may include:  Small red bumps near the genitals, rectum, or mouth. These bumps turn into blisters and then turn into sores.  Flu-like symptoms, including: ? Fever. ? Body aches. ? Swollen lymph nodes. ? Headache.  Painful urination.  Pain and itching in the genital area or rectal area.  Vaginal discharge.  Tingling or shooting pain in the legs and buttocks. Generally, symptoms are more severe and last longer during the first (primary) outbreak. Flu-like symptoms are also more common during the primary outbreak. How is this diagnosed? Genital herpes may be diagnosed based on:  A physical exam.  Your medical history.  Blood tests.  A test of a fluid sample (culture) from an open sore. How is this treated? There is no cure for this condition, but treatment with antiviral medicines that are taken by mouth (orally) can do the following:  Speed up healing and relieve symptoms.  Help to reduce the spread of the virus to sexual partners.  Limit the chance of future outbreaks, or make future outbreaks shorter.  Lessen symptoms of future outbreaks. Your health care provider may also recommend pain relief medicines, such as aspirin or ibuprofen. Follow these instructions at home: Sexual activity  Do not have sexual contact during active outbreaks.  Practice safe sex. Latex condoms and male condoms may help prevent the spread of the herpes virus. General instructions  Keep the affected areas dry  and clean.  Take over-the-counter and prescription medicines only as told by your health care provider.  Avoid rubbing or touching blisters and sores. If you do touch blisters or sores: ? Wash your hands thoroughly with soap and water. ? Do not touch your eyes afterward.  To help relieve pain or itching, you may take the following actions as directed by your health care  provider: ? Apply a cold, wet cloth (cold compress) to affected areas 4-6 times a day. ? Apply a substance that protects your skin and reduces bleeding (astringent). ? Apply a gel that helps relieve pain around sores (lidocaine gel). ? Take a warm, shallow bath that cleans the genital area (sitz bath).  Keep all follow-up visits as told by your health care provider. This is important. How is this prevented?  Use condoms. Although anyone can get genital herpes during sexual contact, even with the use of a condom, a condom can provide some protection.  Avoid having multiple sexual partners.  Talk with your sexual partner about any symptoms either of you may have. Also, talk with your partner about any history of STIs.  Get tested for STIs before you have sex. Ask your partner to do the same.  Do not have sexual contact if you have symptoms of genital herpes. Contact a health care provider if:  Your symptoms are not improving with medicine.  Your symptoms return.  You have new symptoms.  You have a fever.  You have abdominal pain.  You have redness, swelling, or pain in your eye.  You notice new sores on other parts of your body.  You are a woman and experience bleeding between menstrual periods.  You have had herpes and you become pregnant or plan to become pregnant. Summary  Genital herpes is a common sexually transmitted infection (STI) that is caused by the herpes simplex virus (HSV) type 1 or type 2.  These viruses are most often spread through sexual contact with an infected person.  You are more likely to develop this condition if you have sex with many partners or you have unprotected sex.  Most people do not have symptoms (asymptomatic) or have mild symptoms that may be mistaken for other skin problems. Symptoms occur as outbreaks that may happen months or years apart.  There is no cure for this condition, but treatment with oral antiviral medicines can reduce  symptoms, reduce the chance of spreading the virus to a partner, prevent future outbreaks, or shorten future outbreaks. This information is not intended to replace advice given to you by your health care provider. Make sure you discuss any questions you have with your health care provider. Document Released: 12/20/1999 Document Revised: 11/22/2015 Document Reviewed: 11/22/2015 Elsevier Interactive Patient Education  2019 [employer].

## 2018-01-07 LAB — URINE CYTOLOGY ANCILLARY ONLY
Chlamydia: NEGATIVE
Neisseria Gonorrhea: NEGATIVE
Trichomonas: NEGATIVE

## 2018-01-07 LAB — RPR: RPR Ser Ql: NONREACTIVE

## 2018-01-07 LAB — HIV ANTIBODY (ROUTINE TESTING W REFLEX): HIV 1&2 Ab, 4th Generation: NONREACTIVE

## 2018-01-11 LAB — HSV(HERPES SIMPLEX VRS) I + II AB-IGM: HSVI/II Comb IgM: 0.96 Ratio — ABNORMAL HIGH (ref 0.00–0.90)

## 2018-03-01 ENCOUNTER — Ambulatory Visit: Payer: BC Managed Care – PPO | Admitting: Physician Assistant

## 2018-03-01 ENCOUNTER — Encounter: Payer: Self-pay | Admitting: Physician Assistant

## 2018-03-01 ENCOUNTER — Other Ambulatory Visit: Payer: Self-pay

## 2018-03-01 VITALS — BP 118/78 | HR 83 | Temp 98.4°F | Resp 16 | Ht 70.0 in | Wt 197.0 lb

## 2018-03-01 DIAGNOSIS — J019 Acute sinusitis, unspecified: Secondary | ICD-10-CM

## 2018-03-01 DIAGNOSIS — B9689 Other specified bacterial agents as the cause of diseases classified elsewhere: Secondary | ICD-10-CM | POA: Diagnosis not present

## 2018-03-01 MED ORDER — AMOXICILLIN-POT CLAVULANATE 875-125 MG PO TABS: 1.0000 | ORAL_TABLET | Freq: Two times a day (BID) | ORAL | 0 refills | Status: AC

## 2018-03-01 MED ORDER — BENZONATATE 100 MG PO CAPS: 100.0000 mg | ORAL_CAPSULE | Freq: Three times a day (TID) | ORAL | 0 refills | Status: DC | PRN

## 2018-03-01 NOTE — Patient Instructions (Signed)
Please take antibiotic as directed.  Increase fluid intake.  Use Saline nasal spray.  Take a daily multivitamin. Tessalon as directed for cough.  Place a humidifier in the bedroom.  Please call or return clinic if symptoms are not improving.  Sinusitis Sinusitis is redness, soreness, and swelling (inflammation) of the paranasal sinuses. Paranasal sinuses are air pockets within the bones of your face (beneath the eyes, the middle of the forehead, or above the eyes). In healthy paranasal sinuses, mucus is able to drain out, and air is able to circulate through them by way of your nose. However, when your paranasal sinuses are inflamed, mucus and air can become trapped. This can allow bacteria and other germs to grow and cause infection. Sinusitis can develop quickly and last only a short time (acute) or continue over a long period (chronic). Sinusitis that lasts for more than 12 weeks is considered chronic.  CAUSES  Causes of sinusitis include:  Allergies.  Structural abnormalities, such as displacement of the cartilage that separates your nostrils (deviated septum), which can decrease the air flow through your nose and sinuses and affect sinus drainage.  Functional abnormalities, such as when the small hairs (cilia) that line your sinuses and help remove mucus do not work properly or are not present. SYMPTOMS  Symptoms of acute and chronic sinusitis are the same. The primary symptoms are pain and pressure around the affected sinuses. Other symptoms include:  Upper toothache.  Earache.  Headache.  Bad breath.  Decreased sense of smell and taste.  A cough, which worsens when you are lying flat.  Fatigue.  Fever.  Thick drainage from your nose, which often is green and may contain pus (purulent).  Swelling and warmth over the affected sinuses. DIAGNOSIS  Your caregiver will perform a physical exam. During the exam, your caregiver may:  Look in your nose for signs of abnormal growths  in your nostrils (nasal polyps).  Tap over the affected sinus to check for signs of infection.  View the inside of your sinuses (endoscopy) with a special imaging device with a light attached (endoscope), which is inserted into your sinuses. If your caregiver suspects that you have chronic sinusitis, one or more of the following tests may be recommended:  Allergy tests.  Nasal culture A sample of mucus is taken from your nose and sent to a lab and screened for bacteria.  Nasal cytology A sample of mucus is taken from your nose and examined by your caregiver to determine if your sinusitis is related to an allergy. TREATMENT  Most cases of acute sinusitis are related to a viral infection and will resolve on their own within 10 days. Sometimes medicines are prescribed to help relieve symptoms (pain medicine, decongestants, nasal steroid sprays, or saline sprays).  However, for sinusitis related to a bacterial infection, your caregiver will prescribe antibiotic medicines. These are medicines that will help kill the bacteria causing the infection.  Rarely, sinusitis is caused by a fungal infection. In theses cases, your caregiver will prescribe antifungal medicine. For some cases of chronic sinusitis, surgery is needed. Generally, these are cases in which sinusitis recurs more than 3 times per year, despite other treatments. HOME CARE INSTRUCTIONS   Drink plenty of water. Water helps thin the mucus so your sinuses can drain more easily.  Use a humidifier.  Inhale steam 3 to 4 times a day (for example, sit in the bathroom with the shower running).  Apply a warm, moist washcloth to your face 3   to 4 times a day, or as directed by your caregiver.  Use saline nasal sprays to help moisten and clean your sinuses.  Take over-the-counter or prescription medicines for pain, discomfort, or fever only as directed by your caregiver. SEEK IMMEDIATE MEDICAL CARE IF:  You have increasing pain or severe  headaches.  You have nausea, vomiting, or drowsiness.  You have swelling around your face.  You have vision problems.  You have a stiff neck.  You have difficulty breathing. MAKE SURE YOU:   Understand these instructions.  Will watch your condition.  Will get help right away if you are not doing well or get worse. Document Released: 12/22/2004 Document Revised: 03/16/2011 Document Reviewed: 01/06/2011 [employer] Patient Information 2014 [employer].   

## 2018-03-01 NOTE — Progress Notes (Signed)
Patient presents to clinic today c/o 1 week of worsening nasal congestion, sinus pressure, sinus pain and cough productive of thick yellow/green sputum. Denies fever or chills. Notes ear pressure but denies dizziness or tinnitus. Denies chest pain or SOB. Denies recent travel or sick contact.    History reviewed. No pertinent past medical history.  Current Outpatient Medications on File Prior to Visit  Medication Sig Dispense Refill  . aspirin EC 325 MG tablet Take 1 tablet (325 mg total) by mouth daily. For 30 days post op for DVT Prophylaxis 30 tablet 0  . Multiple Vitamins-Minerals (MULTIVITAMIN WITH MINERALS) tablet Take 1 tablet by mouth daily.     No current facility-administered medications on file prior to visit.     Allergies  Allergen Reactions  . Oxycodone Hcl Nausea And Vomiting    Family History  Problem Relation Age of Onset  . Healthy Mother   . Healthy Father     Social History   Socioeconomic History  . Marital status: Married    Spouse name: Judeth Cornfield  . Number of children: Not on file  . Years of education: Not on file  . Highest education level: Not on file  Occupational History  . Occupation: Runner, broadcasting/film/video  Social Needs  . Financial resource strain: Not on file  . Food insecurity:    Worry: Not on file    Inability: Not on file  . Transportation needs:    Medical: Not on file    Non-medical: Not on file  Tobacco Use  . Smoking status: Never Smoker  . Smokeless tobacco: Never Used  Substance and Sexual Activity  . Alcohol use: Yes    Comment: OCCASIONAL  . Drug use: No  . Sexual activity: Yes  Lifestyle  . Physical activity:    Days per week: Not on file    Minutes per session: Not on file  . Stress: Not on file  Relationships  . Social connections:    Talks on phone: Not on file    Gets together: Not on file    Attends religious service: Not on file    Active member of club or organization: Not on file    Attends meetings of clubs or  organizations: Not on file    Relationship status: Not on file  Other Topics Concern  . Not on file  Social History Narrative  . Not on file    Review of Systems - See HPI.  All other ROS are negative.  BP 118/78   Pulse 83   Temp 98.4 F (36.9 C) (Oral)   Resp 16   Ht 5\' 10"  (1.778 m)   Wt 197 lb (89.4 kg)   SpO2 98%   BMI 28.27 kg/m   Physical Exam Vitals signs reviewed.  Constitutional:      Appearance: Normal appearance.  HENT:     Head: Normocephalic and atraumatic.     Right Ear: Tympanic membrane normal.     Left Ear: Tympanic membrane normal.     Nose: Congestion and rhinorrhea present.     Right Turbinates: Enlarged and swollen.     Left Turbinates: Enlarged and swollen.     Right Sinus: Maxillary sinus tenderness present.     Mouth/Throat:     Mouth: Mucous membranes are moist.     Pharynx: Oropharynx is clear. No oropharyngeal exudate or posterior oropharyngeal erythema.  Eyes:     Conjunctiva/sclera: Conjunctivae normal.  Neck:     Musculoskeletal: Neck supple.  Cardiovascular:     Rate and Rhythm: Normal rate and regular rhythm.     Heart sounds: Normal heart sounds.  Pulmonary:     Effort: Pulmonary effort is normal.     Breath sounds: Normal breath sounds.  Neurological:     Mental Status: He is alert.     Recent Results (from the past 2160 hour(s))  Urine cytology ancillary only([facility])     Status: None   Collection Time: 01/06/18 12:00 AM  Result Value Ref Range   Chlamydia Negative     Comment: Normal Reference Range - Negative   Neisseria gonorrhea Negative     Comment: Normal Reference Range - Negative   Trichomonas Negative     Comment: Normal Reference Range - Negative  HIV Antibody (routine testing w rflx)     Status: None   Collection Time: 01/06/18 11:16 AM  Result Value Ref Range   HIV 1&2 Ab, 4th Generation NON-REACTIVE NON-REACTI    Comment: HIV-1 antigen and HIV-1/HIV-2 antibodies were not detected. There is no  laboratory evidence of HIV infection. Marland Kitchen PLEASE NOTE: This information has been disclosed to you from records whose confidentiality may be protected by state law.  If your state requires such protection, then the state law prohibits you from making any further disclosure of the information without the specific written consent of the person to whom it pertains, or as otherwise permitted by law. A general authorization for the release of medical or other information is NOT sufficient for this purpose. . For additional information please refer to http://education.questdiagnostics.com/faq/FAQ106 (This link is being provided for informational/ educational purposes only.) . Marland Kitchen The performance of this assay has not been clinically validated in patients less than 35 years old. .   RPR     Status: None   Collection Time: 01/06/18 11:16 AM  Result Value Ref Range   RPR Ser Ql NON-REACTIVE NON-REACTI  HSV(herpes simplex vrs) 1+2 ab-IgM     Status: Abnormal   Collection Time: 01/06/18 11:16 AM  Result Value Ref Range   HSVI/II Comb IgM 0.96 (H) 0.00 - 0.90 Ratio    Comment:                                  Negative        <0.91                                  Equivocal 0.91 - 1.09                                  Positive        >1.09    Assessment/Plan: 1. Acute bacterial sinusitis Rx Augmentin.  Increase fluids.  Rest.  Saline nasal spray.  Probiotic.  Mucinex as directed.  Humidifier in bedroom. Tessalon per orders.  Call or return to clinic if symptoms are not improving.  - amoxicillin-clavulanate (AUGMENTIN) 875-125 MG tablet; Take 1 tablet by mouth 2 (two) times daily.  Dispense: 14 tablet; Refill: 0 - benzonatate (TESSALON) 100 MG capsule; Take 1 capsule (100 mg total) by mouth 3 (three) times daily as needed for cough.  Dispense: 30 capsule; Refill: 0   Piedad Climes, PA-C

## 2018-03-29 ENCOUNTER — Other Ambulatory Visit: Payer: Self-pay | Admitting: *Deleted

## 2018-06-07 ENCOUNTER — Ambulatory Visit: Payer: Self-pay
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
## 2018-06-07 NOTE — Telephone Encounter (Signed)
Provider aware

## 2018-12-04 ENCOUNTER — Encounter: Payer: Self-pay | Admitting: Physician Assistant

## 2018-12-07 ENCOUNTER — Other Ambulatory Visit: Payer: Self-pay

## 2018-12-07 DIAGNOSIS — Z20822 Contact with and (suspected) exposure to covid-19: Secondary | ICD-10-CM

## 2018-12-09 ENCOUNTER — Encounter: Payer: Self-pay | Admitting: Family Medicine

## 2018-12-09 LAB — NOVEL CORONAVIRUS, NAA: SARS-CoV-2, NAA: DETECTED — AB

## 2018-12-19 IMAGING — RF DG C-ARM 61-120 MIN
1 series · 4 of 4 positions shown · non-contrast
Comparison: 05/12/2016 CT.

CLINICAL DATA: 55-year-old male with left tibial nail placement for
fracture. Subsequent encounter.

EXAM:
DG C-ARM 61-120 MIN; LEFT TIBIA AND FIBULA - 2 VIEW

[Series 1: run · 4 of 4 slices shown]
[im 1/4]
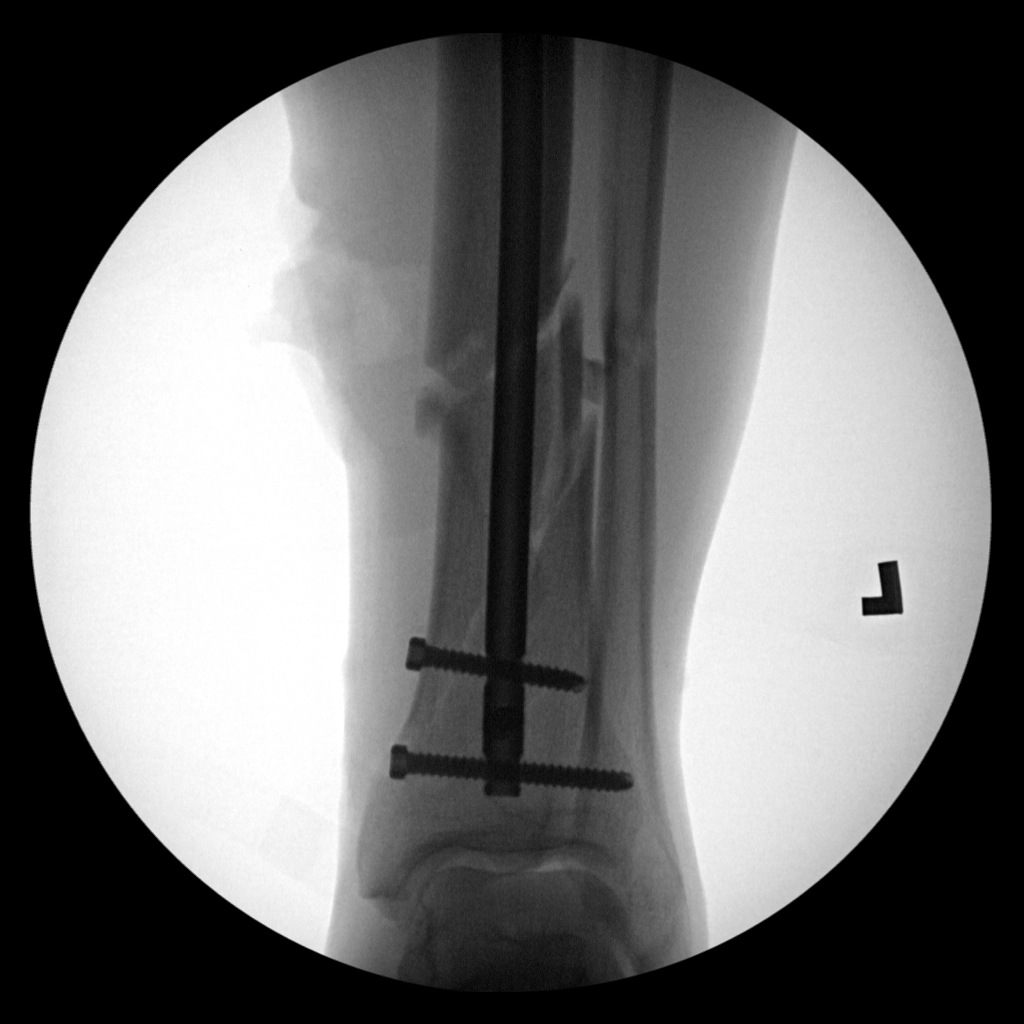
[im 2/4]
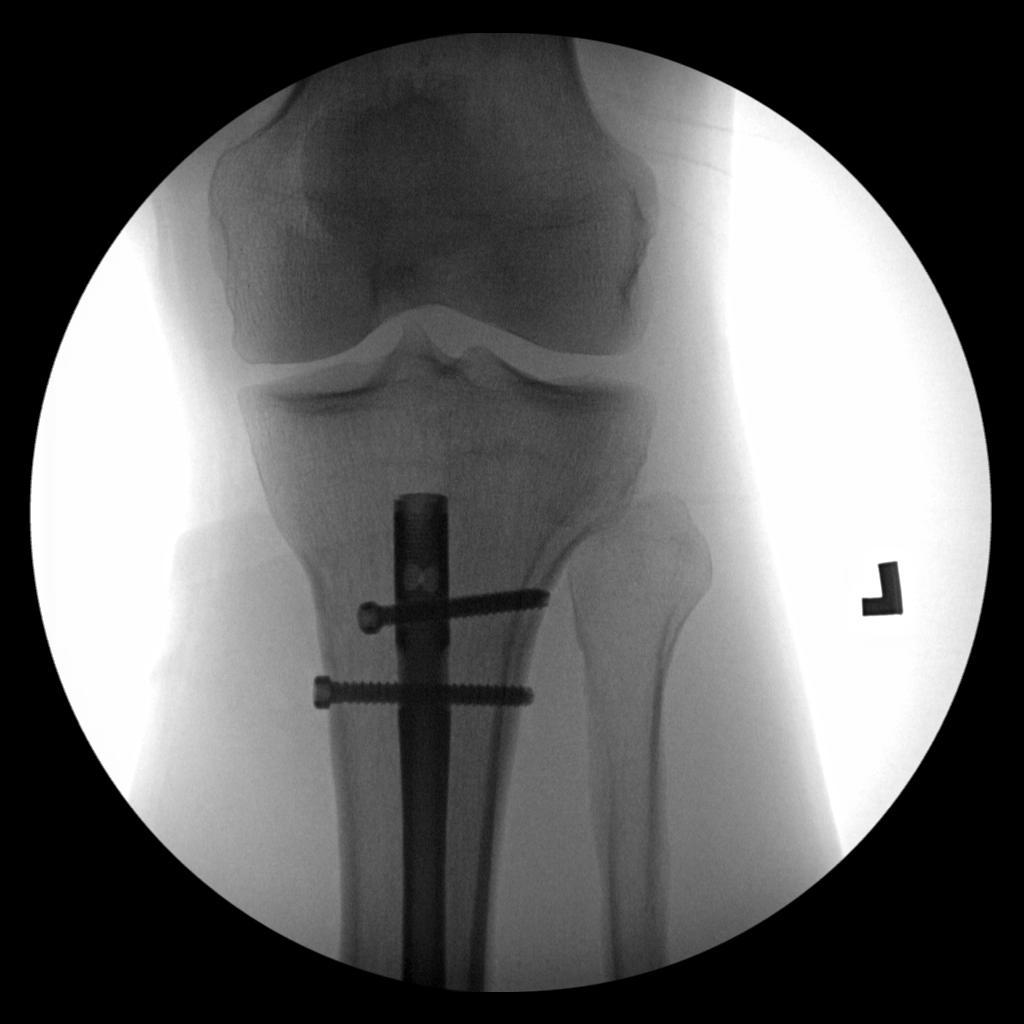
[im 3/4]
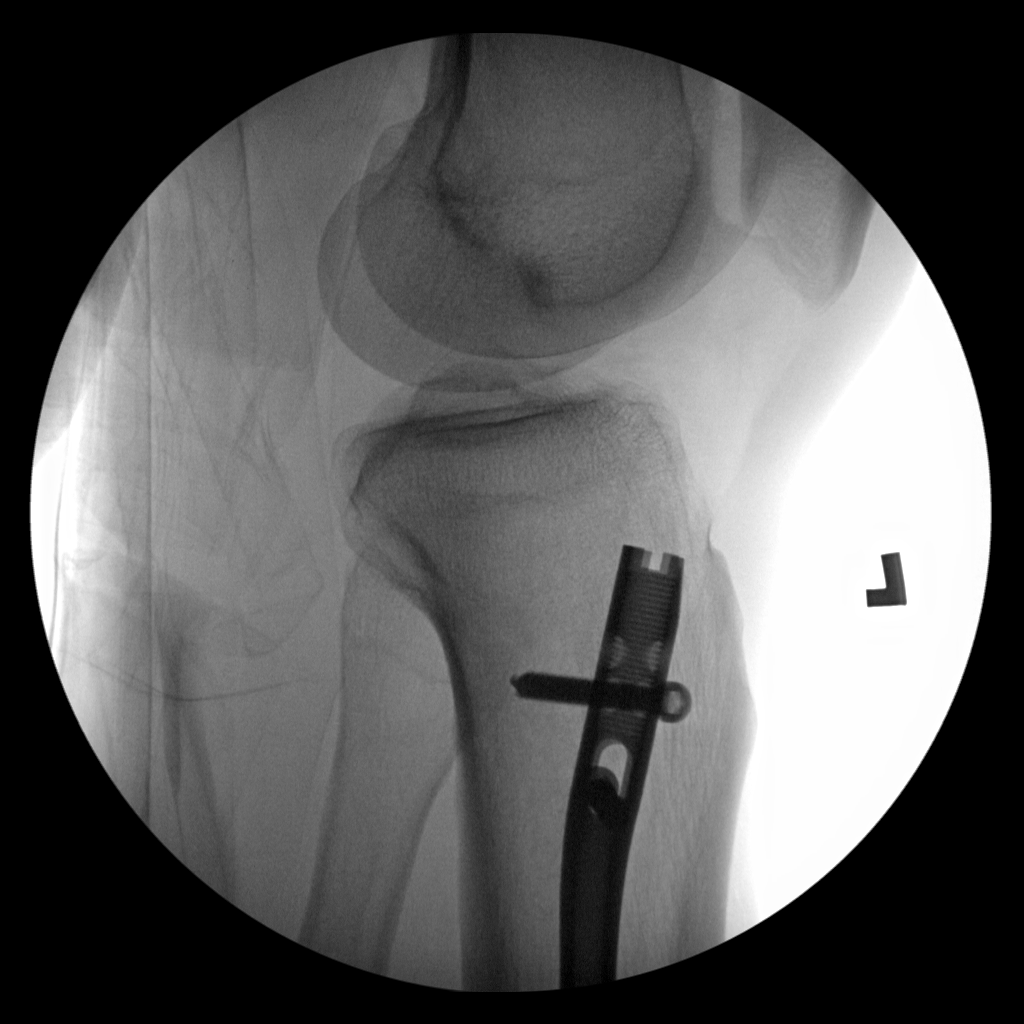
[im 4/4]
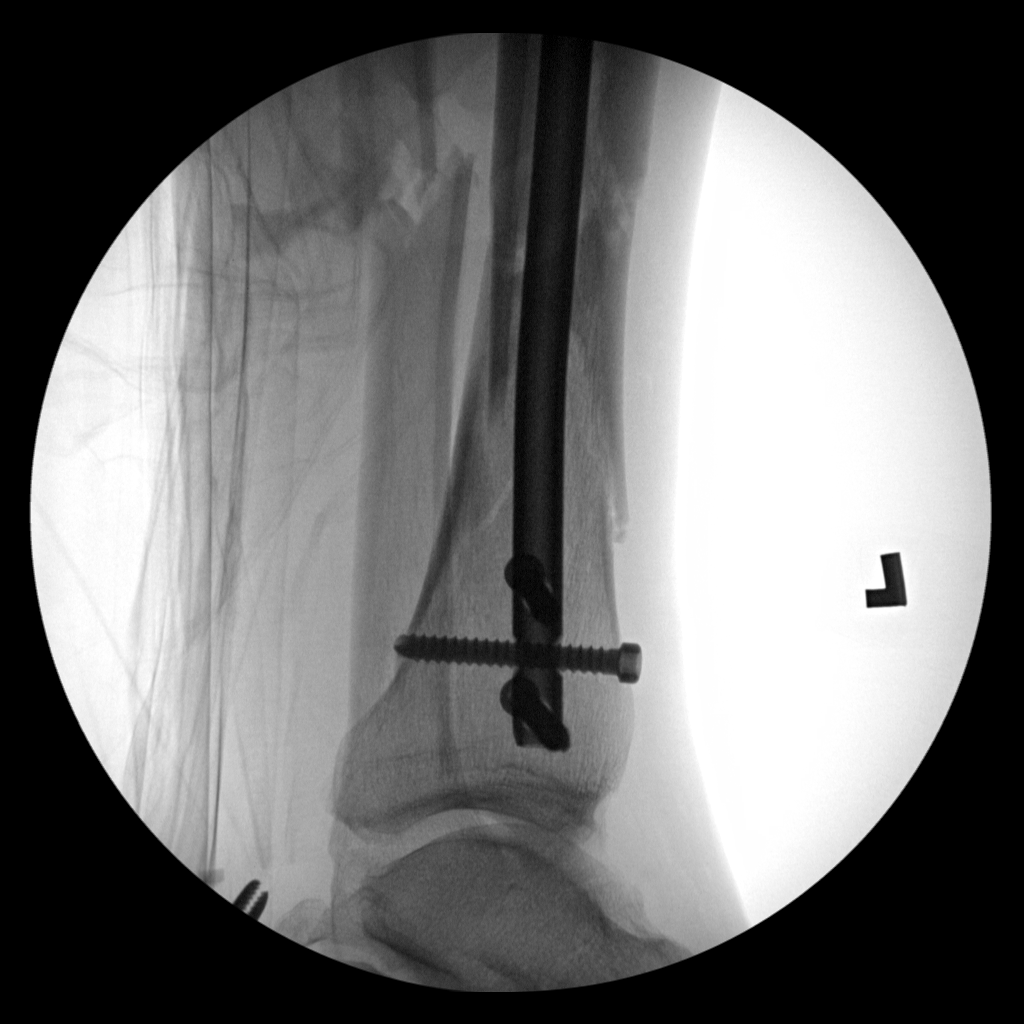

[4 of 4 positions shown; findings below may reference images not displayed]

FINDINGS: Four intraoperative C-arm views submitted for review after surgery.

Placement of tibial rod with proximal and distal fixation screws.
Better alignment of spiral comminuted fracture fragments of the
distal left tibia.

Better alignment although persistent mild incongruity of distal
fibular fracture.
IMPRESSION: Open reduction and internal fixation left tibial fracture.

## 2018-12-19 IMAGING — CR DG TIBIA/FIBULA PORT 2V*L*
4 series · 4 of 4 positions shown · non-contrast
Comparison: Earlier today.

CLINICAL DATA: Tibial nail placement for markedly comminuted and
displaced distal tibial and fibular shaft fractures.

EXAM:
PORTABLE LEFT TIBIA AND FIBULA - 2 VIEW

[AP (1 of 2)]
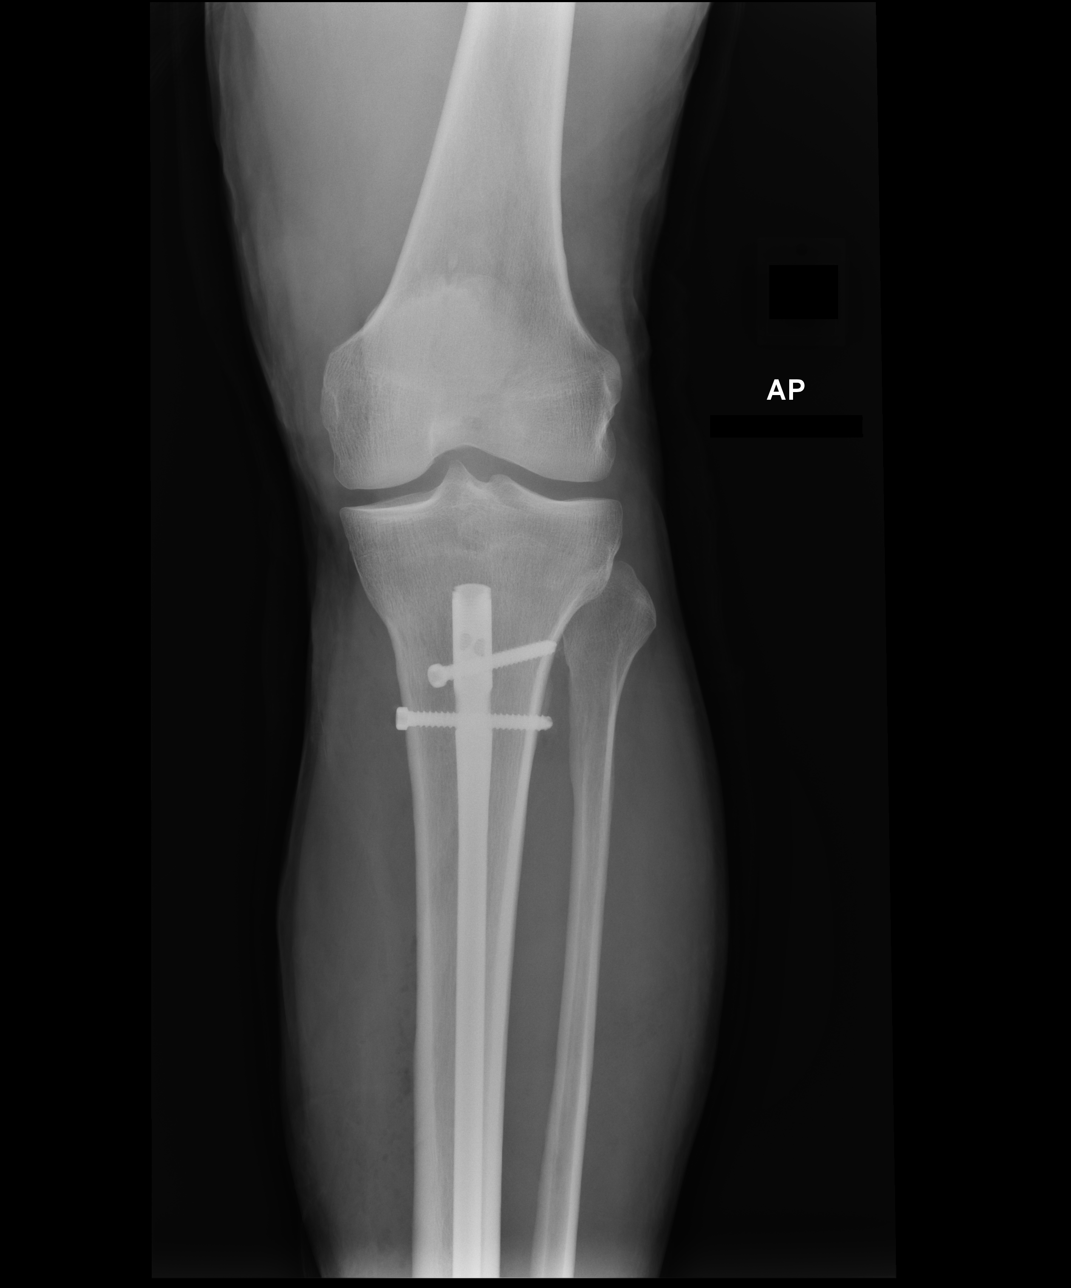

[AP (2 of 2)]
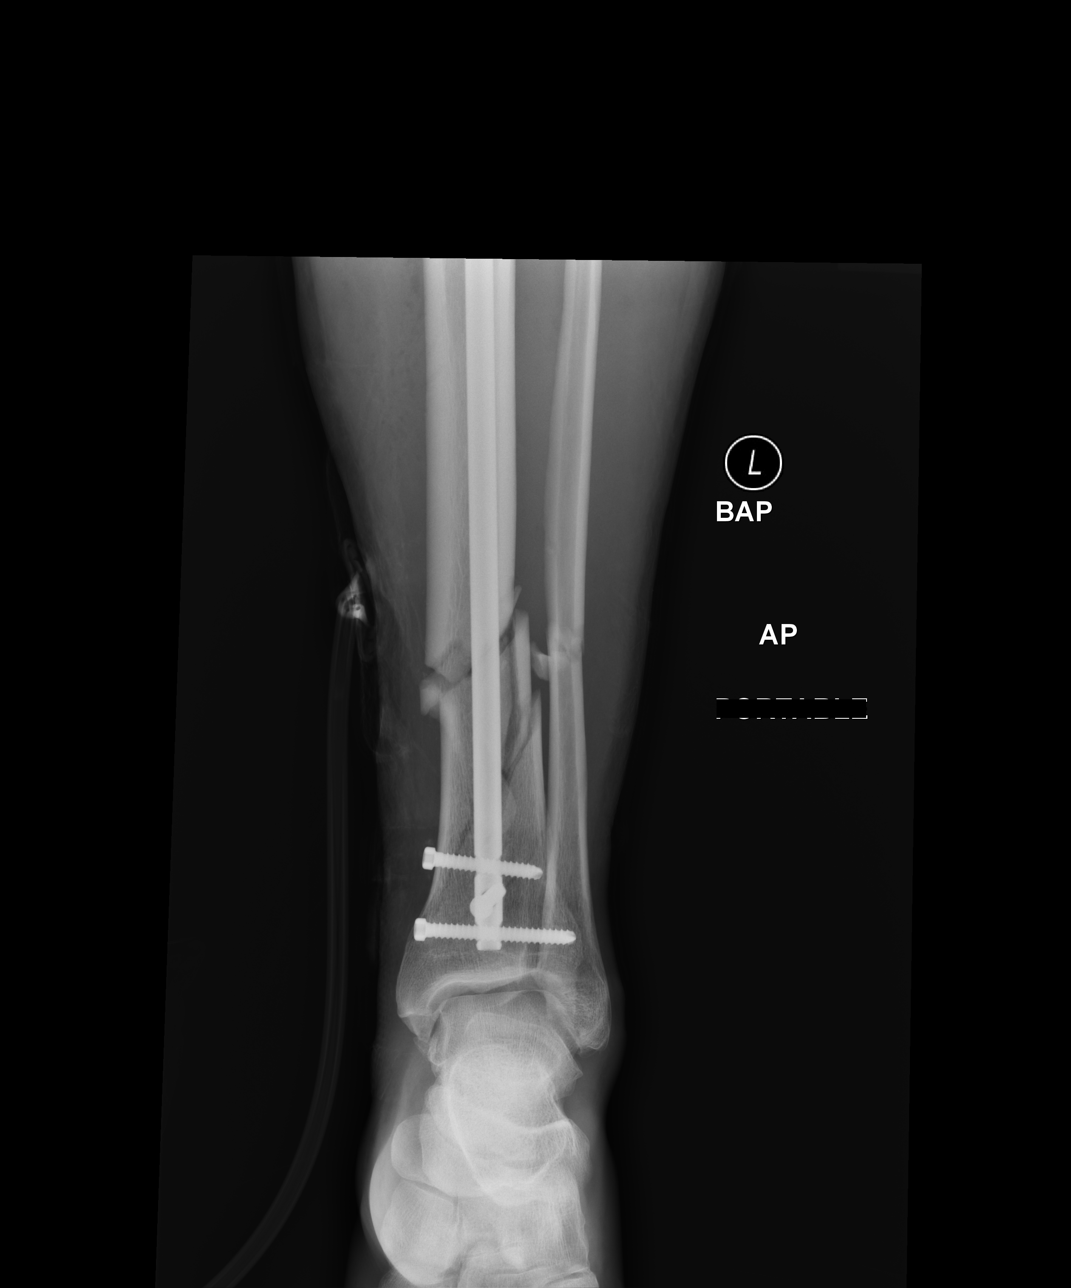

[lateral (1 of 2)]
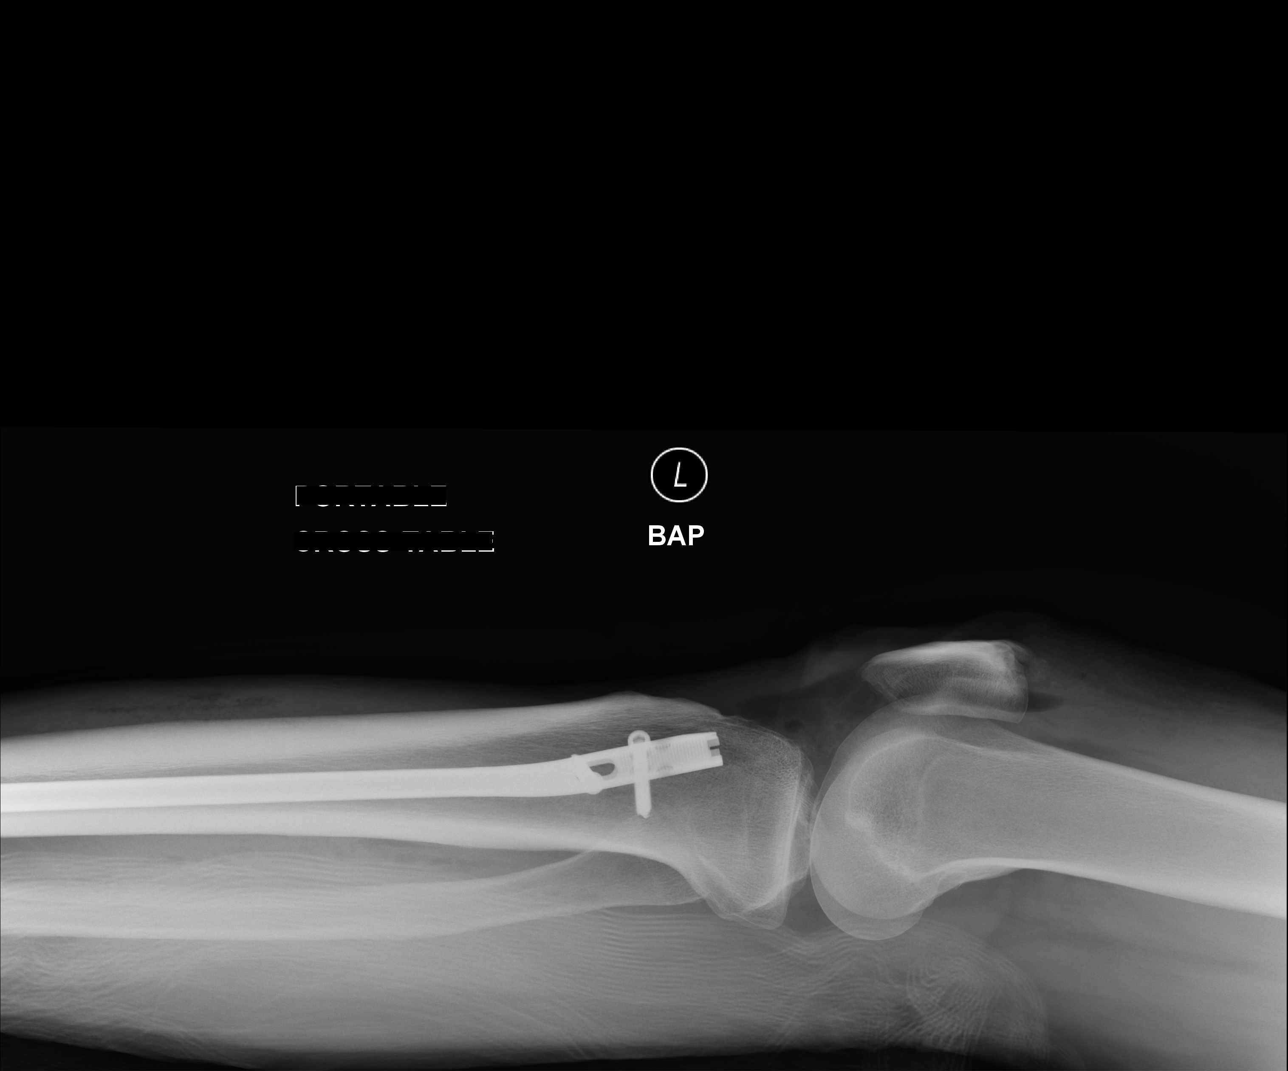

[lateral (2 of 2)]
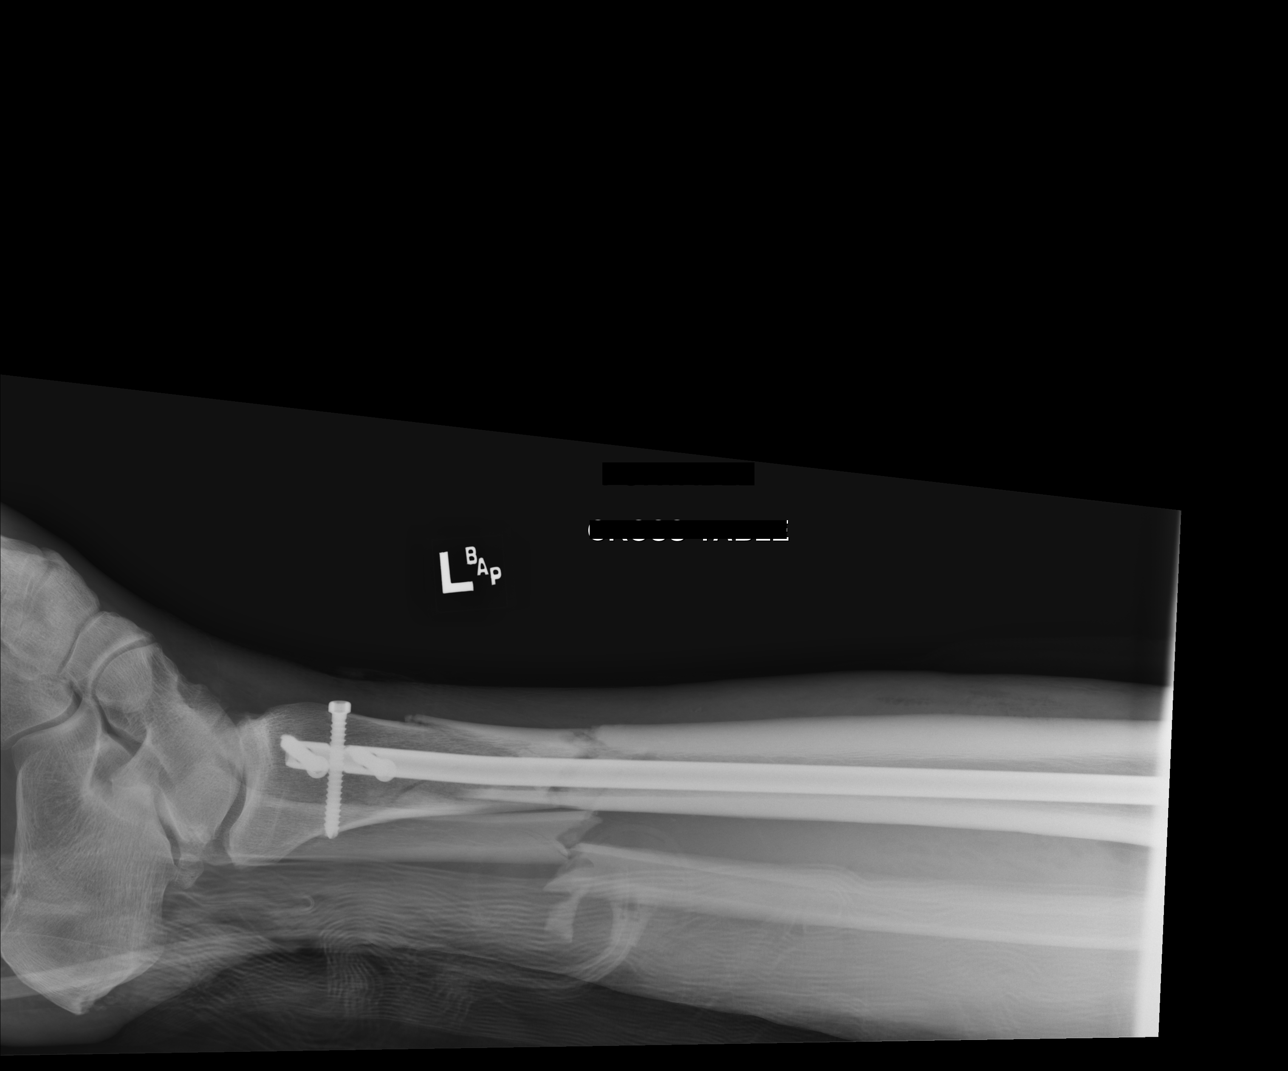

[4 of 4 positions shown; findings below may reference images not displayed]

FINDINGS: Interval intramedullary rod and screw fixation of the previously
demonstrated severely comminuted distal tibial shaft fracture.
Significantly improved position and alignment with mild lateral
displacement of the middle fragment and slightly greater lateral
displacement of the distal fragment. Minimal posterior displacement
of the distal fragment.

A comminuted distal fibular shaft fracture is again demonstrated
with greater than [DATE] shaft width of anterior displacement of the
distal fragment.
IMPRESSION: Interval hardware fixation of the previously demonstrated comminuted
distal fibular shaft fracture with improved position and alignment.
There is also mildly improved position and alignment of the
comminuted distal fibular shaft fracture.
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
## 2019-01-11 ENCOUNTER — Ambulatory Visit: Payer: Self-pay | Admitting: Physician Assistant

## 2019-01-11 ENCOUNTER — Encounter: Payer: Self-pay | Admitting: Physician Assistant

## 2019-01-11 NOTE — Telephone Encounter (Signed)
Patient will need to be schedule for preventive exam/CPE so I can fill out health forms as this has not been done in the past year.

## 2019-01-12 NOTE — Telephone Encounter (Signed)
His would need to be in person as I need to document certain PE findings for his forms I believe.

## 2019-01-13 ENCOUNTER — Ambulatory Visit: Payer: Self-pay | Admitting: Physician Assistant

## 2019-10-20 ENCOUNTER — Encounter: Payer: Self-pay | Admitting: Physician Assistant

## 2019-10-20 ENCOUNTER — Ambulatory Visit: Payer: BC Managed Care – PPO | Admitting: Physician Assistant

## 2019-10-20 ENCOUNTER — Other Ambulatory Visit: Payer: Self-pay

## 2019-10-20 VITALS — BP 128/80 | HR 72 | Temp 98.3°F | Resp 17 | Wt 195.2 lb

## 2019-10-20 DIAGNOSIS — M79671 Pain in right foot: Secondary | ICD-10-CM

## 2019-10-20 DIAGNOSIS — R55 Syncope and collapse: Secondary | ICD-10-CM

## 2019-10-20 DIAGNOSIS — H8110 Benign paroxysmal vertigo, unspecified ear: Secondary | ICD-10-CM | POA: Diagnosis not present

## 2019-10-20 DIAGNOSIS — N5082 Scrotal pain: Secondary | ICD-10-CM | POA: Diagnosis not present

## 2019-10-20 DIAGNOSIS — G8929 Other chronic pain: Secondary | ICD-10-CM
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
## 2019-10-20 NOTE — Patient Instructions (Signed)
Please go to the lab today for blood work.  I will call you with your results. We will alter treatment regimen(s) if indicated by your results.   You will be contacted for assessment by Podiatry regarding ongoing foot issue.  Keep hydrated and keep a well-balanced diet.  If labs are unremarkable we may want to consider further workup for episodic dizziness with ENT.  Avoid heavy lifting or overexertion.  Tylenol or Ibuprofen for mild pain. If anything worsens I would want a repeat US of the scrotum.

## 2019-10-21 LAB — CBC WITH DIFFERENTIAL/PLATELET
Absolute Monocytes: 363 cells/uL (ref 200–950)
Basophils Absolute: 22 cells/uL (ref 0–200)
Basophils Relative: 0.3 %
Eosinophils Absolute: 266 cells/uL (ref 15–500)
Eosinophils Relative: 3.6 %
HCT: 44.9 % (ref 38.5–50.0)
Hemoglobin: 15.4 g/dL (ref 13.2–17.1)
Lymphs Abs: 1606 cells/uL (ref 850–3900)
MCH: 32.8 pg (ref 27.0–33.0)
MCHC: 34.3 g/dL (ref 32.0–36.0)
MCV: 95.7 fL (ref 80.0–100.0)
MPV: 10.6 fL (ref 7.5–12.5)
Monocytes Relative: 4.9 %
Neutro Abs: 5143 cells/uL (ref 1500–7800)
Neutrophils Relative %: 69.5 %
Platelets: 222 10*3/uL (ref 140–400)
RBC: 4.69 10*6/uL (ref 4.20–5.80)
RDW: 12.1 % (ref 11.0–15.0)
Total Lymphocyte: 21.7 %
WBC: 7.4 10*3/uL (ref 3.8–10.8)

## 2019-10-21 LAB — COMPREHENSIVE METABOLIC PANEL
AG Ratio: 1.6 (calc) (ref 1.0–2.5)
ALT: 13 U/L (ref 9–46)
AST: 16 U/L (ref 10–35)
Albumin: 4.4 g/dL (ref 3.6–5.1)
Alkaline phosphatase (APISO): 62 U/L (ref 35–144)
BUN: 16 mg/dL (ref 7–25)
CO2: 29 mmol/L (ref 20–32)
Calcium: 9.2 mg/dL (ref 8.6–10.3)
Chloride: 103 mmol/L (ref 98–110)
Creat: 1 mg/dL (ref 0.70–1.33)
Globulin: 2.7 g/dL (calc) (ref 1.9–3.7)
Glucose, Bld: 123 mg/dL — ABNORMAL HIGH (ref 65–99)
Potassium: 4.1 mmol/L (ref 3.5–5.3)
Sodium: 138 mmol/L (ref 135–146)
Total Bilirubin: 0.7 mg/dL (ref 0.2–1.2)
Total Protein: 7.1 g/dL (ref 6.1–8.1)

## 2019-10-23 ENCOUNTER — Other Ambulatory Visit: Payer: Self-pay

## 2019-10-23 DIAGNOSIS — R42 Dizziness and giddiness: Secondary | ICD-10-CM

## 2019-10-23 DIAGNOSIS — H938X3 Other specified disorders of ear, bilateral: Secondary | ICD-10-CM

## 2019-10-24 ENCOUNTER — Telehealth: Payer: Self-pay | Admitting: Physician Assistant

## 2019-10-24 NOTE — Telephone Encounter (Signed)
Patient called and said he was returning your call???

## 2019-10-27 NOTE — Telephone Encounter (Signed)
Called patient back and lmovm.
